# Patient Record
Sex: Female | Born: 1960 | Race: Black or African American | Hispanic: No | Marital: Married | Smoking: Never smoker
Health system: Southern US, Community
[De-identification: ages and names within clinical notes are randomized; demographics above are authoritative.]

## PROBLEM LIST (undated history)

## (undated) DIAGNOSIS — C169 Malignant neoplasm of stomach, unspecified: Secondary | ICD-10-CM

## (undated) DIAGNOSIS — I10 Essential (primary) hypertension: Secondary | ICD-10-CM

## (undated) DIAGNOSIS — K56609 Unspecified intestinal obstruction, unspecified as to partial versus complete obstruction: Secondary | ICD-10-CM

## (undated) DIAGNOSIS — D219 Benign neoplasm of connective and other soft tissue, unspecified: Secondary | ICD-10-CM

## (undated) DIAGNOSIS — D279 Benign neoplasm of unspecified ovary: Secondary | ICD-10-CM

## (undated) HISTORY — DX: Benign neoplasm of connective and other soft tissue, unspecified: D21.9

## (undated) HISTORY — DX: Malignant neoplasm of stomach, unspecified: C16.9

## (undated) HISTORY — DX: Benign neoplasm of unspecified ovary: D27.9

## (undated) HISTORY — DX: Essential (primary) hypertension: I10

## (undated) SURGERY — REMOVAL PORT-A-CATH
Anesthesia: LOCAL | Laterality: Left

---

## 1999-05-28 ENCOUNTER — Encounter: Payer: Self-pay | Admitting: Emergency Medicine

## 1999-05-28 ENCOUNTER — Emergency Department (HOSPITAL_COMMUNITY): Admission: EM | Admit: 1999-05-28 | Discharge: 1999-05-28 | Payer: Self-pay | Admitting: Emergency Medicine

## 1999-07-20 ENCOUNTER — Emergency Department (HOSPITAL_COMMUNITY): Admission: EM | Admit: 1999-07-20 | Discharge: 1999-07-20 | Payer: Self-pay | Admitting: Emergency Medicine

## 1999-07-20 ENCOUNTER — Encounter: Payer: Self-pay | Admitting: Emergency Medicine

## 2001-02-12 ENCOUNTER — Encounter: Payer: Self-pay | Admitting: Occupational Medicine

## 2001-02-12 ENCOUNTER — Encounter: Admission: RE | Admit: 2001-02-12 | Discharge: 2001-02-12 | Payer: Self-pay | Admitting: Occupational Medicine

## 2001-02-26 ENCOUNTER — Encounter: Admission: RE | Admit: 2001-02-26 | Discharge: 2001-03-30 | Payer: Self-pay | Admitting: Occupational Medicine

## 2003-05-20 ENCOUNTER — Ambulatory Visit (HOSPITAL_COMMUNITY): Admission: RE | Admit: 2003-05-20 | Discharge: 2003-05-20 | Payer: Self-pay | Admitting: Family Medicine

## 2003-05-20 ENCOUNTER — Encounter: Payer: Self-pay | Admitting: Family Medicine

## 2003-05-26 ENCOUNTER — Encounter: Admission: RE | Admit: 2003-05-26 | Discharge: 2003-05-26 | Payer: Self-pay | Admitting: Family Medicine

## 2003-05-26 ENCOUNTER — Encounter: Payer: Self-pay | Admitting: Family Medicine

## 2005-08-08 ENCOUNTER — Emergency Department (HOSPITAL_COMMUNITY): Admission: EM | Admit: 2005-08-08 | Discharge: 2005-08-08 | Payer: Self-pay | Admitting: Emergency Medicine

## 2006-08-11 ENCOUNTER — Emergency Department (HOSPITAL_COMMUNITY): Admission: EM | Admit: 2006-08-11 | Discharge: 2006-08-11 | Payer: Self-pay | Admitting: Emergency Medicine

## 2006-10-20 ENCOUNTER — Emergency Department (HOSPITAL_COMMUNITY): Admission: EM | Admit: 2006-10-20 | Discharge: 2006-10-20 | Payer: Self-pay | Admitting: Emergency Medicine

## 2007-05-04 ENCOUNTER — Encounter: Admission: RE | Admit: 2007-05-04 | Discharge: 2007-05-04 | Payer: Self-pay | Admitting: Internal Medicine

## 2008-08-21 ENCOUNTER — Emergency Department (HOSPITAL_COMMUNITY): Admission: EM | Admit: 2008-08-21 | Discharge: 2008-08-21 | Payer: Self-pay | Admitting: Emergency Medicine

## 2009-04-11 ENCOUNTER — Encounter: Admission: RE | Admit: 2009-04-11 | Discharge: 2009-04-11 | Payer: Self-pay | Admitting: Specialist

## 2009-10-14 HISTORY — PX: ESOPHAGOGASTRODUODENOSCOPY: SHX1529

## 2010-01-03 ENCOUNTER — Encounter: Admission: RE | Admit: 2010-01-03 | Discharge: 2010-01-03 | Payer: Self-pay | Admitting: Gastroenterology

## 2010-01-12 DIAGNOSIS — C169 Malignant neoplasm of stomach, unspecified: Secondary | ICD-10-CM

## 2010-01-12 HISTORY — DX: Malignant neoplasm of stomach, unspecified: C16.9

## 2010-02-05 ENCOUNTER — Inpatient Hospital Stay (HOSPITAL_COMMUNITY): Admission: RE | Admit: 2010-02-05 | Discharge: 2010-02-11 | Payer: Self-pay | Admitting: General Surgery

## 2010-02-05 ENCOUNTER — Encounter (INDEPENDENT_AMBULATORY_CARE_PROVIDER_SITE_OTHER): Payer: Self-pay | Admitting: General Surgery

## 2010-02-05 HISTORY — PX: BILROTH II PROCEDURE: SHX1232

## 2010-02-11 ENCOUNTER — Inpatient Hospital Stay (HOSPITAL_COMMUNITY): Admission: EM | Admit: 2010-02-11 | Discharge: 2010-02-14 | Payer: Self-pay | Admitting: Emergency Medicine

## 2010-02-14 ENCOUNTER — Ambulatory Visit: Payer: Self-pay | Admitting: Hematology and Oncology

## 2010-02-22 ENCOUNTER — Ambulatory Visit: Admission: RE | Admit: 2010-02-22 | Discharge: 2010-05-21 | Payer: Self-pay | Admitting: Radiation Oncology

## 2010-02-22 LAB — COMPREHENSIVE METABOLIC PANEL
Albumin: 4.3 g/dL (ref 3.5–5.2)
Alkaline Phosphatase: 73 U/L (ref 39–117)
BUN: 9 mg/dL (ref 6–23)
CO2: 19 mEq/L (ref 19–32)
Calcium: 9.6 mg/dL (ref 8.4–10.5)
Chloride: 102 mEq/L (ref 96–112)
Glucose, Bld: 99 mg/dL (ref 70–99)
Potassium: 4.1 mEq/L (ref 3.5–5.3)
Sodium: 135 mEq/L (ref 135–145)
Total Protein: 7.2 g/dL (ref 6.0–8.3)

## 2010-02-22 LAB — CBC WITH DIFFERENTIAL/PLATELET
Basophils Absolute: 0 10*3/uL (ref 0.0–0.1)
Eosinophils Absolute: 0.3 10*3/uL (ref 0.0–0.5)
HCT: 29.2 % — ABNORMAL LOW (ref 34.8–46.6)
HGB: 9.5 g/dL — ABNORMAL LOW (ref 11.6–15.9)
MONO#: 0.5 10*3/uL (ref 0.1–0.9)
NEUT#: 3.3 10*3/uL (ref 1.5–6.5)
NEUT%: 51.3 % (ref 38.4–76.8)
RDW: 14.7 % — ABNORMAL HIGH (ref 11.2–14.5)
lymph#: 2.4 10*3/uL (ref 0.9–3.3)

## 2010-02-27 ENCOUNTER — Encounter: Admission: RE | Admit: 2010-02-27 | Discharge: 2010-02-27 | Payer: Self-pay | Admitting: General Surgery

## 2010-03-01 ENCOUNTER — Ambulatory Visit (HOSPITAL_BASED_OUTPATIENT_CLINIC_OR_DEPARTMENT_OTHER): Admission: RE | Admit: 2010-03-01 | Discharge: 2010-03-01 | Payer: Self-pay | Admitting: General Surgery

## 2010-03-01 HISTORY — PX: PORTACATH PLACEMENT: SHX2246

## 2010-03-02 ENCOUNTER — Ambulatory Visit (HOSPITAL_COMMUNITY): Admission: RE | Admit: 2010-03-02 | Discharge: 2010-03-02 | Payer: Self-pay | Admitting: Hematology and Oncology

## 2010-03-08 LAB — CBC WITH DIFFERENTIAL/PLATELET
Eosinophils Absolute: 0.2 10*3/uL (ref 0.0–0.5)
HGB: 9.4 g/dL — ABNORMAL LOW (ref 11.6–15.9)
MCV: 79.1 fL — ABNORMAL LOW (ref 79.5–101.0)
MONO%: 6 % (ref 0.0–14.0)
NEUT#: 2 10*3/uL (ref 1.5–6.5)
RBC: 3.6 10*6/uL — ABNORMAL LOW (ref 3.70–5.45)
RDW: 14.1 % (ref 11.2–14.5)
WBC: 4.5 10*3/uL (ref 3.9–10.3)
lymph#: 2 10*3/uL (ref 0.9–3.3)

## 2010-03-08 LAB — COMPREHENSIVE METABOLIC PANEL
AST: 13 U/L (ref 0–37)
Albumin: 4.4 g/dL (ref 3.5–5.2)
Alkaline Phosphatase: 65 U/L (ref 39–117)
Calcium: 9.5 mg/dL (ref 8.4–10.5)
Chloride: 106 mEq/L (ref 96–112)
Glucose, Bld: 97 mg/dL (ref 70–99)
Potassium: 3.8 mEq/L (ref 3.5–5.3)
Sodium: 140 mEq/L (ref 135–145)
Total Protein: 6.8 g/dL (ref 6.0–8.3)

## 2010-03-16 ENCOUNTER — Ambulatory Visit: Payer: Self-pay | Admitting: Hematology and Oncology

## 2010-03-21 LAB — CBC WITH DIFFERENTIAL/PLATELET
BASO%: 0.3 % (ref 0.0–2.0)
EOS%: 1.3 % (ref 0.0–7.0)
LYMPH%: 43.7 % (ref 14.0–49.7)
MCH: 26.2 pg (ref 25.1–34.0)
MCHC: 33.3 g/dL (ref 31.5–36.0)
MONO#: 0.1 10*3/uL (ref 0.1–0.9)
MONO%: 1.5 % (ref 0.0–14.0)
Platelets: 390 10*3/uL (ref 145–400)
RBC: 3.88 10*6/uL (ref 3.70–5.45)
WBC: 6 10*3/uL (ref 3.9–10.3)

## 2010-03-21 LAB — COMPREHENSIVE METABOLIC PANEL
ALT: 10 U/L (ref 0–35)
AST: 11 U/L (ref 0–37)
Alkaline Phosphatase: 53 U/L (ref 39–117)
CO2: 19 mEq/L (ref 19–32)
Creatinine, Ser: 0.92 mg/dL (ref 0.40–1.20)
Sodium: 138 mEq/L (ref 135–145)
Total Bilirubin: 0.5 mg/dL (ref 0.3–1.2)
Total Protein: 7.3 g/dL (ref 6.0–8.3)

## 2010-03-23 LAB — CBC WITH DIFFERENTIAL/PLATELET
BASO%: 0.5 % (ref 0.0–2.0)
HCT: 28.5 % — ABNORMAL LOW (ref 34.8–46.6)
LYMPH%: 48.3 % (ref 14.0–49.7)
MCH: 26.2 pg (ref 25.1–34.0)
MCHC: 33.4 g/dL (ref 31.5–36.0)
MCV: 78.5 fL — ABNORMAL LOW (ref 79.5–101.0)
MONO%: 2.7 % (ref 0.0–14.0)
NEUT%: 46.4 % (ref 38.4–76.8)
Platelets: 397 10*3/uL (ref 145–400)
RBC: 3.63 10*6/uL — ABNORMAL LOW (ref 3.70–5.45)

## 2010-03-23 LAB — BASIC METABOLIC PANEL
CO2: 20 mEq/L (ref 19–32)
Calcium: 9.1 mg/dL (ref 8.4–10.5)
Creatinine, Ser: 0.89 mg/dL (ref 0.40–1.20)
Sodium: 139 mEq/L (ref 135–145)

## 2010-04-10 LAB — CBC WITH DIFFERENTIAL/PLATELET
BASO%: 0.2 % (ref 0.0–2.0)
EOS%: 1 % (ref 0.0–7.0)
HCT: 33.8 % — ABNORMAL LOW (ref 34.8–46.6)
HGB: 10.7 g/dL — ABNORMAL LOW (ref 11.6–15.9)
MCH: 25.1 pg (ref 25.1–34.0)
MCHC: 31.7 g/dL (ref 31.5–36.0)
MONO#: 0.6 10*3/uL (ref 0.1–0.9)
NEUT%: 49.6 % (ref 38.4–76.8)
RDW: 15 % — ABNORMAL HIGH (ref 11.2–14.5)
WBC: 8.1 10*3/uL (ref 3.9–10.3)
lymph#: 3.4 10*3/uL — ABNORMAL HIGH (ref 0.9–3.3)

## 2010-04-18 ENCOUNTER — Ambulatory Visit: Payer: Self-pay | Admitting: Hematology and Oncology

## 2010-04-18 LAB — COMPREHENSIVE METABOLIC PANEL
Albumin: 4.7 g/dL (ref 3.5–5.2)
CO2: 24 mEq/L (ref 19–32)
Calcium: 10.2 mg/dL (ref 8.4–10.5)
Chloride: 103 mEq/L (ref 96–112)
Glucose, Bld: 73 mg/dL (ref 70–99)
Potassium: 4.1 mEq/L (ref 3.5–5.3)
Sodium: 138 mEq/L (ref 135–145)
Total Bilirubin: 0.4 mg/dL (ref 0.3–1.2)
Total Protein: 7.9 g/dL (ref 6.0–8.3)

## 2010-04-18 LAB — CBC WITH DIFFERENTIAL/PLATELET
Eosinophils Absolute: 0 10*3/uL (ref 0.0–0.5)
HCT: 33.5 % — ABNORMAL LOW (ref 34.8–46.6)
LYMPH%: 27.7 % (ref 14.0–49.7)
MONO#: 0.5 10*3/uL (ref 0.1–0.9)
NEUT#: 5.7 10*3/uL (ref 1.5–6.5)
Platelets: 268 10*3/uL (ref 145–400)
RBC: 4.24 10*6/uL (ref 3.70–5.45)
WBC: 8.6 10*3/uL (ref 3.9–10.3)

## 2010-04-24 LAB — CBC WITH DIFFERENTIAL/PLATELET
BASO%: 0.3 % (ref 0.0–2.0)
Eosinophils Absolute: 0.1 10*3/uL (ref 0.0–0.5)
MONO#: 0.4 10*3/uL (ref 0.1–0.9)
NEUT#: 3.5 10*3/uL (ref 1.5–6.5)
RBC: 4.23 10*6/uL (ref 3.70–5.45)
RDW: 15.8 % — ABNORMAL HIGH (ref 11.2–14.5)
WBC: 5.2 10*3/uL (ref 3.9–10.3)
lymph#: 1.2 10*3/uL (ref 0.9–3.3)

## 2010-05-03 LAB — BASIC METABOLIC PANEL
CO2: 22 mEq/L (ref 19–32)
Calcium: 9.4 mg/dL (ref 8.4–10.5)
Chloride: 103 mEq/L (ref 96–112)
Creatinine, Ser: 0.88 mg/dL (ref 0.40–1.20)
Glucose, Bld: 126 mg/dL — ABNORMAL HIGH (ref 70–99)
Sodium: 137 mEq/L (ref 135–145)

## 2010-05-03 LAB — CBC WITH DIFFERENTIAL/PLATELET
Eosinophils Absolute: 0.1 10*3/uL (ref 0.0–0.5)
HCT: 32.1 % — ABNORMAL LOW (ref 34.8–46.6)
LYMPH%: 12.4 % — ABNORMAL LOW (ref 14.0–49.7)
MONO#: 0.3 10*3/uL (ref 0.1–0.9)
NEUT#: 3.8 10*3/uL (ref 1.5–6.5)
NEUT%: 79.4 % — ABNORMAL HIGH (ref 38.4–76.8)
Platelets: 234 10*3/uL (ref 145–400)
WBC: 4.8 10*3/uL (ref 3.9–10.3)
lymph#: 0.6 10*3/uL — ABNORMAL LOW (ref 0.9–3.3)

## 2010-05-08 LAB — CBC WITH DIFFERENTIAL/PLATELET
BASO%: 0.2 % (ref 0.0–2.0)
Basophils Absolute: 0 10*3/uL (ref 0.0–0.1)
EOS%: 1.7 % (ref 0.0–7.0)
HCT: 34.3 % — ABNORMAL LOW (ref 34.8–46.6)
LYMPH%: 15 % (ref 14.0–49.7)
MCH: 25.2 pg (ref 25.1–34.0)
MCHC: 31.5 g/dL (ref 31.5–36.0)
MCV: 80.1 fL (ref 79.5–101.0)
MONO%: 11.1 % (ref 0.0–14.0)
NEUT%: 72 % (ref 38.4–76.8)
lymph#: 0.6 10*3/uL — ABNORMAL LOW (ref 0.9–3.3)

## 2010-05-21 ENCOUNTER — Ambulatory Visit: Payer: Self-pay | Admitting: Hematology and Oncology

## 2010-05-23 LAB — COMPREHENSIVE METABOLIC PANEL
CO2: 23 mEq/L (ref 19–32)
Creatinine, Ser: 0.91 mg/dL (ref 0.40–1.20)
Glucose, Bld: 93 mg/dL (ref 70–99)
Total Bilirubin: 0.4 mg/dL (ref 0.3–1.2)
Total Protein: 7.3 g/dL (ref 6.0–8.3)

## 2010-05-23 LAB — CBC WITH DIFFERENTIAL/PLATELET
Basophils Absolute: 0 10*3/uL (ref 0.0–0.1)
Eosinophils Absolute: 0.1 10*3/uL (ref 0.0–0.5)
HCT: 34.1 % — ABNORMAL LOW (ref 34.8–46.6)
LYMPH%: 7.2 % — ABNORMAL LOW (ref 14.0–49.7)
MONO#: 0.5 10*3/uL (ref 0.1–0.9)
NEUT#: 3.7 10*3/uL (ref 1.5–6.5)
NEUT%: 80 % — ABNORMAL HIGH (ref 38.4–76.8)
Platelets: 223 10*3/uL (ref 145–400)
WBC: 4.7 10*3/uL (ref 3.9–10.3)

## 2010-06-20 ENCOUNTER — Ambulatory Visit: Payer: Self-pay | Admitting: Hematology and Oncology

## 2010-06-21 LAB — COMPREHENSIVE METABOLIC PANEL WITH GFR
ALT: 19 U/L (ref 0–35)
AST: 21 U/L (ref 0–37)
Albumin: 4.1 g/dL (ref 3.5–5.2)
Alkaline Phosphatase: 95 U/L (ref 39–117)
BUN: 16 mg/dL (ref 6–23)
CO2: 24 meq/L (ref 19–32)
Calcium: 9.9 mg/dL (ref 8.4–10.5)
Chloride: 107 meq/L (ref 96–112)
Creatinine, Ser: 1.08 mg/dL (ref 0.40–1.20)
Glucose, Bld: 128 mg/dL — ABNORMAL HIGH (ref 70–99)
Potassium: 4.5 meq/L (ref 3.5–5.3)
Sodium: 142 meq/L (ref 135–145)
Total Bilirubin: 0.4 mg/dL (ref 0.3–1.2)
Total Protein: 7.2 g/dL (ref 6.0–8.3)

## 2010-06-21 LAB — CBC WITH DIFFERENTIAL/PLATELET
BASO%: 0.4 % (ref 0.0–2.0)
Basophils Absolute: 0 10e3/uL (ref 0.0–0.1)
EOS%: 4.6 % (ref 0.0–7.0)
Eosinophils Absolute: 0.2 10e3/uL (ref 0.0–0.5)
HCT: 32.4 % — ABNORMAL LOW (ref 34.8–46.6)
HGB: 10.6 g/dL — ABNORMAL LOW (ref 11.6–15.9)
LYMPH%: 24.6 % (ref 14.0–49.7)
MCH: 25.9 pg (ref 25.1–34.0)
MCHC: 32.8 g/dL (ref 31.5–36.0)
MCV: 78.9 fL — ABNORMAL LOW (ref 79.5–101.0)
MONO#: 0.4 10e3/uL (ref 0.1–0.9)
MONO%: 10.1 % (ref 0.0–14.0)
NEUT#: 2.4 10e3/uL (ref 1.5–6.5)
NEUT%: 60.3 % (ref 38.4–76.8)
Platelets: 251 10e3/uL (ref 145–400)
RBC: 4.1 10e6/uL (ref 3.70–5.45)
RDW: 19 % — ABNORMAL HIGH (ref 11.2–14.5)
WBC: 4 10e3/uL (ref 3.9–10.3)
lymph#: 1 10e3/uL (ref 0.9–3.3)

## 2010-06-21 LAB — CEA: CEA: 0.6 ng/mL (ref 0.0–5.0)

## 2010-07-03 ENCOUNTER — Emergency Department (HOSPITAL_COMMUNITY): Admission: EM | Admit: 2010-07-03 | Discharge: 2010-07-04 | Payer: Self-pay | Admitting: Occupational Therapy

## 2010-07-09 LAB — CBC WITH DIFFERENTIAL/PLATELET
Eosinophils Absolute: 0 10*3/uL (ref 0.0–0.5)
HGB: 9.7 g/dL — ABNORMAL LOW (ref 11.6–15.9)
MONO#: 0.4 10*3/uL (ref 0.1–0.9)
NEUT#: 2.6 10*3/uL (ref 1.5–6.5)
Platelets: 240 10*3/uL (ref 145–400)
RBC: 3.67 10*6/uL — ABNORMAL LOW (ref 3.70–5.45)
RDW: 17.8 % — ABNORMAL HIGH (ref 11.2–14.5)
WBC: 3.9 10*3/uL (ref 3.9–10.3)

## 2010-07-18 LAB — COMPREHENSIVE METABOLIC PANEL
AST: 19 U/L (ref 0–37)
Albumin: 4 g/dL (ref 3.5–5.2)
Alkaline Phosphatase: 89 U/L (ref 39–117)
BUN: 21 mg/dL (ref 6–23)
Glucose, Bld: 158 mg/dL — ABNORMAL HIGH (ref 70–99)
Potassium: 4.3 mEq/L (ref 3.5–5.3)
Sodium: 136 mEq/L (ref 135–145)
Total Bilirubin: 0.4 mg/dL (ref 0.3–1.2)

## 2010-07-18 LAB — CBC WITH DIFFERENTIAL/PLATELET
Basophils Absolute: 0 10*3/uL (ref 0.0–0.1)
EOS%: 0.2 % (ref 0.0–7.0)
LYMPH%: 38 % (ref 14.0–49.7)
MCH: 26.8 pg (ref 25.1–34.0)
MCV: 80.3 fL (ref 79.5–101.0)
MONO%: 16.9 % — ABNORMAL HIGH (ref 0.0–14.0)
Platelets: 298 10*3/uL (ref 145–400)
RBC: 3.87 10*6/uL (ref 3.70–5.45)
RDW: 17.6 % — ABNORMAL HIGH (ref 11.2–14.5)

## 2010-07-20 ENCOUNTER — Ambulatory Visit: Payer: Self-pay | Admitting: Hematology and Oncology

## 2010-07-23 LAB — CBC WITH DIFFERENTIAL/PLATELET
Basophils Absolute: 0 10*3/uL (ref 0.0–0.1)
EOS%: 0.7 % (ref 0.0–7.0)
Eosinophils Absolute: 0 10*3/uL (ref 0.0–0.5)
HCT: 31.7 % — ABNORMAL LOW (ref 34.8–46.6)
HGB: 10.1 g/dL — ABNORMAL LOW (ref 11.6–15.9)
LYMPH%: 20.3 % (ref 14.0–49.7)
MCH: 25.6 pg (ref 25.1–34.0)
MCV: 80.5 fL (ref 79.5–101.0)
MONO%: 14.4 % — ABNORMAL HIGH (ref 0.0–14.0)
NEUT#: 3 10*3/uL (ref 1.5–6.5)
NEUT%: 64.2 % (ref 38.4–76.8)
Platelets: 213 10*3/uL (ref 145–400)
RDW: 16.3 % — ABNORMAL HIGH (ref 11.2–14.5)

## 2010-07-30 LAB — CBC WITH DIFFERENTIAL/PLATELET
EOS%: 0.1 % (ref 0.0–7.0)
Eosinophils Absolute: 0 10*3/uL (ref 0.0–0.5)
LYMPH%: 17.1 % (ref 14.0–49.7)
MCH: 26.5 pg (ref 25.1–34.0)
MCHC: 33 g/dL (ref 31.5–36.0)
MCV: 80.2 fL (ref 79.5–101.0)
MONO%: 1.5 % (ref 0.0–14.0)
Platelets: 224 10*3/uL (ref 145–400)
RBC: 3.9 10*6/uL (ref 3.70–5.45)
RDW: 16.9 % — ABNORMAL HIGH (ref 11.2–14.5)

## 2010-08-06 LAB — COMPREHENSIVE METABOLIC PANEL
ALT: 30 U/L (ref 0–35)
Albumin: 4.1 g/dL (ref 3.5–5.2)
Alkaline Phosphatase: 77 U/L (ref 39–117)
CO2: 23 mEq/L (ref 19–32)
Potassium: 4.3 mEq/L (ref 3.5–5.3)
Sodium: 140 mEq/L (ref 135–145)
Total Bilirubin: 0.5 mg/dL (ref 0.3–1.2)
Total Protein: 7 g/dL (ref 6.0–8.3)

## 2010-08-06 LAB — CBC WITH DIFFERENTIAL/PLATELET
BASO%: 0.3 % (ref 0.0–2.0)
Eosinophils Absolute: 0 10*3/uL (ref 0.0–0.5)
LYMPH%: 17.5 % (ref 14.0–49.7)
MCHC: 33.1 g/dL (ref 31.5–36.0)
MONO#: 0.1 10*3/uL (ref 0.1–0.9)
MONO%: 4.4 % (ref 0.0–14.0)
NEUT#: 2.6 10*3/uL (ref 1.5–6.5)
Platelets: 220 10*3/uL (ref 145–400)
RBC: 3.58 10*6/uL — ABNORMAL LOW (ref 3.70–5.45)
RDW: 16.3 % — ABNORMAL HIGH (ref 11.2–14.5)
WBC: 3.4 10*3/uL — ABNORMAL LOW (ref 3.9–10.3)

## 2010-09-03 ENCOUNTER — Other Ambulatory Visit: Payer: Self-pay | Admitting: Hematology and Oncology

## 2010-09-03 ENCOUNTER — Ambulatory Visit (HOSPITAL_COMMUNITY)
Admission: RE | Admit: 2010-09-03 | Discharge: 2010-09-03 | Payer: Self-pay | Source: Home / Self Care | Admitting: Hematology and Oncology

## 2010-09-03 LAB — COMPREHENSIVE METABOLIC PANEL
AST: 24 U/L (ref 0–37)
Albumin: 3.6 g/dL (ref 3.5–5.2)
BUN: 13 mg/dL (ref 6–23)
CO2: 26 mEq/L (ref 19–32)
Calcium: 10 mg/dL (ref 8.4–10.5)
Chloride: 105 mEq/L (ref 96–112)
Creatinine, Ser: 0.94 mg/dL (ref 0.40–1.20)
Glucose, Bld: 82 mg/dL (ref 70–99)
Potassium: 4.2 mEq/L (ref 3.5–5.3)

## 2010-09-03 LAB — CBC WITH DIFFERENTIAL/PLATELET
Basophils Absolute: 0 10*3/uL (ref 0.0–0.1)
Eosinophils Absolute: 0.2 10*3/uL (ref 0.0–0.5)
HCT: 30.8 % — ABNORMAL LOW (ref 34.8–46.6)
HGB: 10.2 g/dL — ABNORMAL LOW (ref 11.6–15.9)
MCH: 26 pg (ref 25.1–34.0)
MONO#: 0.5 10*3/uL (ref 0.1–0.9)
NEUT#: 2.9 10*3/uL (ref 1.5–6.5)
NEUT%: 64.3 % (ref 38.4–76.8)
RDW: 16.9 % — ABNORMAL HIGH (ref 11.2–14.5)
lymph#: 0.9 10*3/uL (ref 0.9–3.3)

## 2010-09-03 LAB — CEA: CEA: 0.8 ng/mL (ref 0.0–5.0)

## 2010-09-04 ENCOUNTER — Other Ambulatory Visit: Payer: Self-pay | Admitting: Hematology and Oncology

## 2010-09-05 LAB — IRON AND TIBC
%SAT: 16 % — ABNORMAL LOW (ref 20–55)
Iron: 74 ug/dL (ref 42–145)

## 2010-09-05 LAB — VITAMIN B12: Vitamin B-12: 759 pg/mL (ref 211–911)

## 2010-09-05 LAB — FERRITIN: Ferritin: 18 ng/mL (ref 10–291)

## 2010-09-28 ENCOUNTER — Ambulatory Visit: Payer: Self-pay | Admitting: Obstetrics and Gynecology

## 2010-10-01 ENCOUNTER — Ambulatory Visit (HOSPITAL_COMMUNITY)
Admission: RE | Admit: 2010-10-01 | Discharge: 2010-10-01 | Payer: Self-pay | Source: Home / Self Care | Attending: Family Medicine | Admitting: Family Medicine

## 2010-10-05 ENCOUNTER — Ambulatory Visit (HOSPITAL_COMMUNITY)
Admission: RE | Admit: 2010-10-05 | Discharge: 2010-10-05 | Payer: Self-pay | Source: Home / Self Care | Attending: Obstetrics & Gynecology | Admitting: Obstetrics & Gynecology

## 2010-10-18 ENCOUNTER — Ambulatory Visit: Payer: Self-pay | Admitting: Hematology and Oncology

## 2010-10-19 ENCOUNTER — Ambulatory Visit
Admission: RE | Admit: 2010-10-19 | Discharge: 2010-10-19 | Payer: Self-pay | Source: Home / Self Care | Attending: Obstetrics and Gynecology | Admitting: Obstetrics and Gynecology

## 2010-11-02 ENCOUNTER — Other Ambulatory Visit: Payer: Self-pay | Admitting: Hematology and Oncology

## 2010-11-02 DIAGNOSIS — C169 Malignant neoplasm of stomach, unspecified: Secondary | ICD-10-CM

## 2010-11-04 ENCOUNTER — Encounter: Payer: Self-pay | Admitting: Family Medicine

## 2010-11-28 ENCOUNTER — Other Ambulatory Visit (HOSPITAL_COMMUNITY): Payer: Self-pay

## 2010-12-03 ENCOUNTER — Other Ambulatory Visit: Payer: Self-pay | Admitting: Hematology and Oncology

## 2010-12-03 ENCOUNTER — Encounter (HOSPITAL_BASED_OUTPATIENT_CLINIC_OR_DEPARTMENT_OTHER): Payer: BC Managed Care – PPO | Admitting: Hematology and Oncology

## 2010-12-03 ENCOUNTER — Ambulatory Visit (HOSPITAL_COMMUNITY)
Admission: RE | Admit: 2010-12-03 | Discharge: 2010-12-03 | Disposition: A | Discharge status: Home / Self Care | Payer: BC Managed Care – PPO | Source: Ambulatory Visit | Attending: Hematology and Oncology | Admitting: Hematology and Oncology

## 2010-12-03 DIAGNOSIS — D279 Benign neoplasm of unspecified ovary: Secondary | ICD-10-CM | POA: Insufficient documentation

## 2010-12-03 DIAGNOSIS — Z5111 Encounter for antineoplastic chemotherapy: Secondary | ICD-10-CM

## 2010-12-03 DIAGNOSIS — C169 Malignant neoplasm of stomach, unspecified: Secondary | ICD-10-CM

## 2010-12-03 LAB — CBC WITH DIFFERENTIAL/PLATELET
BASO%: 0.5 % (ref 0.0–2.0)
EOS%: 0.7 % (ref 0.0–7.0)
LYMPH%: 21.9 % (ref 14.0–49.7)
MCH: 25.5 pg (ref 25.1–34.0)
MCHC: 33 g/dL (ref 31.5–36.0)
MCV: 77.4 fL — ABNORMAL LOW (ref 79.5–101.0)
MONO%: 8.7 % (ref 0.0–14.0)
Platelets: 279 10*3/uL (ref 145–400)
RBC: 4.7 10*6/uL (ref 3.70–5.45)
RDW: 14.2 % (ref 11.2–14.5)
nRBC: 0 % (ref 0–0)

## 2010-12-03 LAB — CEA: CEA: 1.3 ng/mL (ref 0.0–5.0)

## 2010-12-03 LAB — CMP (CANCER CENTER ONLY)
ALT(SGPT): 27 U/L (ref 10–47)
AST: 25 U/L (ref 11–38)
Albumin: 3.8 g/dL (ref 3.3–5.5)
CO2: 28 mEq/L (ref 18–33)
Calcium: 9.6 mg/dL (ref 8.0–10.3)
Chloride: 100 mEq/L (ref 98–108)
Potassium: 3.9 mEq/L (ref 3.3–4.7)
Sodium: 142 mEq/L (ref 128–145)
Total Protein: 8 g/dL (ref 6.4–8.1)

## 2010-12-03 MED ORDER — IOHEXOL 300 MG/ML  SOLN: 100.0000 mL | Freq: Once | INTRAMUSCULAR | Status: AC | PRN

## 2010-12-05 ENCOUNTER — Encounter (HOSPITAL_BASED_OUTPATIENT_CLINIC_OR_DEPARTMENT_OTHER): Payer: BC Managed Care – PPO | Admitting: Hematology and Oncology

## 2010-12-05 DIAGNOSIS — C169 Malignant neoplasm of stomach, unspecified: Secondary | ICD-10-CM

## 2010-12-05 DIAGNOSIS — Z9089 Acquired absence of other organs: Secondary | ICD-10-CM

## 2010-12-07 ENCOUNTER — Other Ambulatory Visit: Payer: Self-pay | Admitting: Hematology and Oncology

## 2010-12-07 DIAGNOSIS — C169 Malignant neoplasm of stomach, unspecified: Secondary | ICD-10-CM

## 2010-12-27 LAB — COMPREHENSIVE METABOLIC PANEL
AST: 140 U/L — ABNORMAL HIGH (ref 0–37)
Albumin: 3.7 g/dL (ref 3.5–5.2)
Alkaline Phosphatase: 85 U/L (ref 39–117)
BUN: 17 mg/dL (ref 6–23)
CO2: 23 mEq/L (ref 19–32)
Chloride: 105 mEq/L (ref 96–112)
GFR calc non Af Amer: 57 mL/min — ABNORMAL LOW (ref >60)
Potassium: 4.1 mEq/L (ref 3.5–5.1)
Total Bilirubin: 1.1 mg/dL (ref 0.3–1.2)

## 2010-12-27 LAB — CBC
Hemoglobin: 9.1 g/dL — ABNORMAL LOW (ref 12.0–15.0)
MCH: 26.4 pg (ref 26.0–34.0)
MCV: 79 fL (ref 78.0–100.0)
Platelets: 212 10*3/uL (ref 150–400)
RBC: 3.47 MIL/uL — ABNORMAL LOW (ref 3.87–5.11)
WBC: 4.8 10*3/uL (ref 4.0–10.5)

## 2010-12-27 LAB — URINALYSIS, ROUTINE W REFLEX MICROSCOPIC
Bilirubin Urine: NEGATIVE
Hgb urine dipstick: NEGATIVE
Nitrite: NEGATIVE
Protein, ur: NEGATIVE mg/dL
Urobilinogen, UA: 0.2 mg/dL (ref 0.0–1.0)

## 2010-12-27 LAB — DIFFERENTIAL
Basophils Absolute: 0 10*3/uL (ref 0.0–0.1)
Basophils Relative: 0 % (ref 0–1)
Eosinophils Relative: 1 % (ref 0–5)
Monocytes Absolute: 0.1 10*3/uL (ref 0.1–1.0)
Neutro Abs: 3.9 10*3/uL (ref 1.7–7.7)

## 2010-12-27 LAB — URINE MICROSCOPIC-ADD ON

## 2010-12-27 LAB — LIPASE, BLOOD: Lipase: 44 U/L (ref 11–59)

## 2010-12-31 LAB — BASIC METABOLIC PANEL
CO2: 27 mEq/L (ref 19–32)
Calcium: 9.8 mg/dL (ref 8.4–10.5)
GFR calc Af Amer: >60 mL/min (ref >60)
GFR calc non Af Amer: >60 mL/min (ref >60)
Glucose, Bld: 101 mg/dL — ABNORMAL HIGH (ref 70–99)
Potassium: 4.4 mEq/L (ref 3.5–5.1)
Sodium: 138 mEq/L (ref 135–145)

## 2010-12-31 LAB — GLUCOSE, CAPILLARY: Glucose-Capillary: 108 mg/dL — ABNORMAL HIGH (ref 70–99)

## 2011-01-01 LAB — COMPREHENSIVE METABOLIC PANEL
ALT: 119 U/L — ABNORMAL HIGH (ref 0–35)
ALT: 88 U/L — ABNORMAL HIGH (ref 0–35)
ALT: 17 U/L (ref 0–35)
AST: 53 U/L — ABNORMAL HIGH (ref 0–37)
AST: 89 U/L — ABNORMAL HIGH (ref 0–37)
Albumin: 3.4 g/dL — ABNORMAL LOW (ref 3.5–5.2)
Alkaline Phosphatase: 72 U/L (ref 39–117)
CO2: 26 mEq/L (ref 19–32)
CO2: 27 mEq/L (ref 19–32)
Calcium: 8.6 mg/dL (ref 8.4–10.5)
Calcium: 9.5 mg/dL (ref 8.4–10.5)
Calcium: 9.5 mg/dL (ref 8.4–10.5)
Creatinine, Ser: 0.76 mg/dL (ref 0.4–1.2)
GFR calc Af Amer: >60 mL/min (ref >60)
GFR calc Af Amer: >60 mL/min (ref >60)
GFR calc Af Amer: >60 mL/min (ref >60)
GFR calc non Af Amer: >60 mL/min (ref >60)
Glucose, Bld: 103 mg/dL — ABNORMAL HIGH (ref 70–99)
Potassium: 3.4 mEq/L — ABNORMAL LOW (ref 3.5–5.1)
Sodium: 136 mEq/L (ref 135–145)
Sodium: 140 mEq/L (ref 135–145)
Sodium: 140 mEq/L (ref 135–145)
Total Protein: 5.9 g/dL — ABNORMAL LOW (ref 6.0–8.3)
Total Protein: 7.4 g/dL (ref 6.0–8.3)
Total Protein: 7.2 g/dL (ref 6.0–8.3)

## 2011-01-01 LAB — CBC
HCT: 26 % — ABNORMAL LOW (ref 36.0–46.0)
HCT: 28 % — ABNORMAL LOW (ref 36.0–46.0)
HCT: 33.5 % — ABNORMAL LOW (ref 36.0–46.0)
Hemoglobin: 9.5 g/dL — ABNORMAL LOW (ref 12.0–15.0)
MCHC: 33.4 g/dL (ref 30.0–36.0)
MCHC: 33.5 g/dL (ref 30.0–36.0)
MCV: 79.4 fL (ref 78.0–100.0)
MCV: 80.1 fL (ref 78.0–100.0)
Platelets: 302 10*3/uL (ref 150–400)
Platelets: 337 10*3/uL (ref 150–400)
Platelets: 292 10*3/uL (ref 150–400)
RBC: 3.13 MIL/uL — ABNORMAL LOW (ref 3.87–5.11)
RBC: 3.9 MIL/uL (ref 3.87–5.11)
RBC: 3.53 MIL/uL — ABNORMAL LOW (ref 3.87–5.11)
RDW: 13.4 % (ref 11.5–15.5)
RDW: 13.6 % (ref 11.5–15.5)
RDW: 13.8 % (ref 11.5–15.5)
RDW: 14.2 % (ref 11.5–15.5)
WBC: 8.8 10*3/uL (ref 4.0–10.5)

## 2011-01-01 LAB — DIFFERENTIAL
Basophils Absolute: 0 10*3/uL (ref 0.0–0.1)
Basophils Relative: 0 % (ref 0–1)
Eosinophils Absolute: 0.1 10*3/uL (ref 0.0–0.7)
Eosinophils Absolute: 0.3 10*3/uL (ref 0.0–0.7)
Eosinophils Absolute: 0.2 10*3/uL (ref 0.0–0.7)
Eosinophils Relative: 2 % (ref 0–5)
Eosinophils Relative: 6 % — ABNORMAL HIGH (ref 0–5)
Lymphocytes Relative: 22 % (ref 12–46)
Lymphs Abs: 1.7 10*3/uL (ref 0.7–4.0)
Lymphs Abs: 2 10*3/uL (ref 0.7–4.0)
Lymphs Abs: 2.4 10*3/uL (ref 0.7–4.0)
Monocytes Relative: 6 % (ref 3–12)
Monocytes Relative: 7 % (ref 3–12)
Monocytes Relative: 7 % (ref 3–12)
Neutro Abs: 6.1 10*3/uL (ref 1.7–7.7)
Neutrophils Relative %: 46 % (ref 43–77)
Neutrophils Relative %: 69 % (ref 43–77)

## 2011-01-01 LAB — URINALYSIS, MICROSCOPIC ONLY
Glucose, UA: NEGATIVE mg/dL
Hgb urine dipstick: NEGATIVE
Specific Gravity, Urine: 1.006 (ref 1.005–1.030)

## 2011-01-01 LAB — CROSSMATCH: Antibody Screen: NEGATIVE

## 2011-01-01 LAB — ABO/RH: ABO/RH(D): O POS

## 2011-01-01 LAB — URINALYSIS, ROUTINE W REFLEX MICROSCOPIC
Bilirubin Urine: NEGATIVE
Hgb urine dipstick: NEGATIVE
Ketones, ur: 15 mg/dL — AB
Nitrite: NEGATIVE
Protein, ur: NEGATIVE mg/dL
Specific Gravity, Urine: 1.021 (ref 1.005–1.030)
Urobilinogen, UA: 0.2 mg/dL (ref 0.0–1.0)

## 2011-01-01 LAB — CANCER ANTIGEN 27.29: CA 27.29: 10 U/mL (ref 0–39)

## 2011-01-01 LAB — PROTIME-INR: Prothrombin Time: 14.1 seconds (ref 11.6–15.2)

## 2011-01-01 LAB — TYPE AND SCREEN
ABO/RH(D): O POS
Antibody Screen: NEGATIVE

## 2011-01-01 LAB — BASIC METABOLIC PANEL
GFR calc Af Amer: >60 mL/min (ref >60)
GFR calc non Af Amer: >60 mL/min (ref >60)
Potassium: 3.9 mEq/L (ref 3.5–5.1)
Sodium: 134 mEq/L — ABNORMAL LOW (ref 135–145)

## 2011-01-01 LAB — URINE CULTURE

## 2011-01-01 LAB — CEA: CEA: 1 ng/mL (ref 0.0–5.0)

## 2011-01-01 LAB — GASTRIC OCCULT BLOOD (1-CARD TO LAB): Occult Blood, Gastric: POSITIVE — AB

## 2011-01-16 ENCOUNTER — Encounter (HOSPITAL_BASED_OUTPATIENT_CLINIC_OR_DEPARTMENT_OTHER): Payer: BC Managed Care – PPO | Admitting: Hematology and Oncology

## 2011-01-16 DIAGNOSIS — C169 Malignant neoplasm of stomach, unspecified: Secondary | ICD-10-CM

## 2011-01-16 DIAGNOSIS — Z452 Encounter for adjustment and management of vascular access device: Secondary | ICD-10-CM

## 2011-02-27 ENCOUNTER — Encounter (HOSPITAL_BASED_OUTPATIENT_CLINIC_OR_DEPARTMENT_OTHER): Payer: BC Managed Care – PPO | Admitting: Hematology and Oncology

## 2011-02-27 DIAGNOSIS — C169 Malignant neoplasm of stomach, unspecified: Secondary | ICD-10-CM

## 2011-02-27 DIAGNOSIS — Z452 Encounter for adjustment and management of vascular access device: Secondary | ICD-10-CM

## 2011-04-01 ENCOUNTER — Other Ambulatory Visit: Payer: Self-pay | Admitting: Hematology and Oncology

## 2011-04-01 ENCOUNTER — Ambulatory Visit (HOSPITAL_COMMUNITY)
Admission: RE | Admit: 2011-04-01 | Discharge: 2011-04-01 | Disposition: A | Discharge status: Home / Self Care | Payer: BC Managed Care – PPO | Source: Ambulatory Visit | Attending: Hematology and Oncology | Admitting: Hematology and Oncology

## 2011-04-01 ENCOUNTER — Encounter (HOSPITAL_BASED_OUTPATIENT_CLINIC_OR_DEPARTMENT_OTHER): Payer: BC Managed Care – PPO | Admitting: Hematology and Oncology

## 2011-04-01 DIAGNOSIS — Z9089 Acquired absence of other organs: Secondary | ICD-10-CM

## 2011-04-01 DIAGNOSIS — K439 Ventral hernia without obstruction or gangrene: Secondary | ICD-10-CM | POA: Insufficient documentation

## 2011-04-01 DIAGNOSIS — D279 Benign neoplasm of unspecified ovary: Secondary | ICD-10-CM | POA: Insufficient documentation

## 2011-04-01 DIAGNOSIS — K429 Umbilical hernia without obstruction or gangrene: Secondary | ICD-10-CM | POA: Insufficient documentation

## 2011-04-01 DIAGNOSIS — D259 Leiomyoma of uterus, unspecified: Secondary | ICD-10-CM | POA: Insufficient documentation

## 2011-04-01 DIAGNOSIS — C169 Malignant neoplasm of stomach, unspecified: Secondary | ICD-10-CM | POA: Insufficient documentation

## 2011-04-01 LAB — CEA: CEA: 0.6 ng/mL (ref 0.0–5.0)

## 2011-04-01 LAB — CMP (CANCER CENTER ONLY)
Albumin: 3.5 g/dL (ref 3.3–5.5)
Alkaline Phosphatase: 124 U/L — ABNORMAL HIGH (ref 26–84)
BUN, Bld: 17 mg/dL (ref 7–22)
CO2: 30 mEq/L (ref 18–33)
Calcium: 9.6 mg/dL (ref 8.0–10.3)
Glucose, Bld: 97 mg/dL (ref 73–118)
Potassium: 4.3 mEq/L (ref 3.3–4.7)

## 2011-04-01 LAB — CBC WITH DIFFERENTIAL/PLATELET
Basophils Absolute: 0 10*3/uL (ref 0.0–0.1)
Eosinophils Absolute: 0.1 10*3/uL (ref 0.0–0.5)
HGB: 10.9 g/dL — ABNORMAL LOW (ref 11.6–15.9)
MCV: 79.7 fL (ref 79.5–101.0)
MONO#: 0.3 10*3/uL (ref 0.1–0.9)
MONO%: 8.3 % (ref 0.0–14.0)
NEUT#: 1.8 10*3/uL (ref 1.5–6.5)
Platelets: 235 10*3/uL (ref 145–400)
RDW: 13.7 % (ref 11.2–14.5)

## 2011-04-01 MED ORDER — IOHEXOL 300 MG/ML  SOLN
100.0000 mL | Freq: Once | INTRAMUSCULAR | Status: AC | PRN
  Administered 2011-04-01: 100 mL via INTRAVENOUS

## 2011-04-02 ENCOUNTER — Other Ambulatory Visit (HOSPITAL_COMMUNITY): Payer: BC Managed Care – PPO

## 2011-04-04 ENCOUNTER — Other Ambulatory Visit: Payer: Self-pay | Admitting: Hematology and Oncology

## 2011-04-04 ENCOUNTER — Encounter (HOSPITAL_BASED_OUTPATIENT_CLINIC_OR_DEPARTMENT_OTHER): Payer: BC Managed Care – PPO | Admitting: Hematology and Oncology

## 2011-04-04 ENCOUNTER — Encounter: Payer: Self-pay | Admitting: Internal Medicine

## 2011-04-04 DIAGNOSIS — C169 Malignant neoplasm of stomach, unspecified: Secondary | ICD-10-CM

## 2011-04-10 ENCOUNTER — Encounter: Payer: BC Managed Care – PPO | Admitting: Hematology and Oncology

## 2011-04-13 DIAGNOSIS — C169 Malignant neoplasm of stomach, unspecified: Secondary | ICD-10-CM

## 2011-04-13 DIAGNOSIS — D219 Benign neoplasm of connective and other soft tissue, unspecified: Secondary | ICD-10-CM

## 2011-04-13 DIAGNOSIS — D279 Benign neoplasm of unspecified ovary: Secondary | ICD-10-CM

## 2011-04-22 ENCOUNTER — Ambulatory Visit: Payer: BC Managed Care – PPO | Admitting: Family Medicine

## 2011-05-20 ENCOUNTER — Ambulatory Visit (INDEPENDENT_AMBULATORY_CARE_PROVIDER_SITE_OTHER): Payer: Managed Care, Other (non HMO) | Admitting: Internal Medicine

## 2011-05-20 ENCOUNTER — Encounter: Payer: Self-pay | Admitting: Internal Medicine

## 2011-05-20 VITALS — BP 110/64 | HR 68 | Ht 68.0 in | Wt 201.6 lb

## 2011-05-20 DIAGNOSIS — Z1211 Encounter for screening for malignant neoplasm of colon: Secondary | ICD-10-CM

## 2011-05-20 DIAGNOSIS — D649 Anemia, unspecified: Secondary | ICD-10-CM | POA: Insufficient documentation

## 2011-05-20 DIAGNOSIS — C169 Malignant neoplasm of stomach, unspecified: Secondary | ICD-10-CM

## 2011-05-20 MED ORDER — PEG-KCL-NACL-NASULF-NA ASC-C 100 G PO SOLR: 1.0000 | Freq: Once | ORAL | Status: DC

## 2011-05-20 NOTE — Patient Instructions (Signed)
You have been scheduled for a Colonoscopy with separate instructions given. Your prep kit has been sent to your pharmacy for you to pick up. We have checked with Dr. Dalene Carrow and they only want a Colonoscopy.

## 2011-05-20 NOTE — Progress Notes (Signed)
  Subjective:    Patient ID: Nancy [name], female    DOB: 28-Mar-1961, 50 y.o.   MRN: [id number]  HPI S/p gastrectomy and chemotherapy for gastric cancer in 2011. Has never had a colonoscopy. Mild intermittent LUQ pain after meals, no other complaints.   Review of Systems All other ROS negative    Objective:   Physical Exam  Constitutional: She is oriented to person, place, and time. She appears well-developed and well-nourished.  Cardiovascular: Normal rate, regular rhythm and normal heart sounds.   No murmur heard. Pulmonary/Chest: Effort normal and breath sounds normal.  Abdominal: Soft. Bowel sounds are normal. She exhibits no distension and no mass. There is no tenderness. There is no rebound and no guarding.       upper midline scar  Musculoskeletal: Tenderness: midline upper scar.  Neurological: She is alert and oriented to person, place, and time.          Assessment & Plan:

## 2011-05-30 ENCOUNTER — Encounter (HOSPITAL_BASED_OUTPATIENT_CLINIC_OR_DEPARTMENT_OTHER): Payer: Managed Care, Other (non HMO) | Admitting: Hematology and Oncology

## 2011-05-30 DIAGNOSIS — Z452 Encounter for adjustment and management of vascular access device: Secondary | ICD-10-CM

## 2011-05-30 DIAGNOSIS — C169 Malignant neoplasm of stomach, unspecified: Secondary | ICD-10-CM

## 2011-06-10 ENCOUNTER — Ambulatory Visit (AMBULATORY_SURGERY_CENTER): Payer: Managed Care, Other (non HMO) | Admitting: Internal Medicine

## 2011-06-10 ENCOUNTER — Encounter: Payer: Self-pay | Admitting: Internal Medicine

## 2011-06-10 VITALS — BP 145/62 | HR 66 | Temp 98.1°F | Resp 18 | Ht 68.0 in | Wt 202.0 lb

## 2011-06-10 DIAGNOSIS — Z1211 Encounter for screening for malignant neoplasm of colon: Secondary | ICD-10-CM

## 2011-06-10 HISTORY — PX: COLONOSCOPY: SHX174

## 2011-06-10 MED ORDER — SODIUM CHLORIDE 0.9 % IV SOLN: 500.0000 mL | INTRAVENOUS | Status: DC

## 2011-06-10 NOTE — Progress Notes (Signed)
Pressure applied to abdomen to reach cecum.  

## 2011-06-10 NOTE — Patient Instructions (Addendum)
The colonoscopy was normal. We thought you were sedated during most of the procedure as it looked like you were asleep. If the medication did not provide the desired effect then I suggest that you have deeper sedation with propofol in the future and I have put that recommendation in your records. You need another routine repeat colonoscopy in 10 years - 2022. Iva Boop, MD, [facility]  Normal colonoscopy Discharge instructions per blue and green sheets Consider propofol sedation( deeper sedation) for future procedures Next screening colonoscopy in 10 years. We will mail you a letter to remind you if this so you can call and schedule this exam.

## 2011-06-11 ENCOUNTER — Telehealth: Payer: Self-pay | Admitting: *Deleted

## 2011-06-11 NOTE — Telephone Encounter (Signed)

## 2011-07-17 ENCOUNTER — Telehealth (INDEPENDENT_AMBULATORY_CARE_PROVIDER_SITE_OTHER): Payer: Self-pay

## 2011-07-17 ENCOUNTER — Other Ambulatory Visit (INDEPENDENT_AMBULATORY_CARE_PROVIDER_SITE_OTHER): Payer: Self-pay | Admitting: General Surgery

## 2011-07-17 ENCOUNTER — Encounter: Payer: Self-pay | Admitting: Internal Medicine

## 2011-07-17 DIAGNOSIS — C169 Malignant neoplasm of stomach, unspecified: Secondary | ICD-10-CM

## 2011-07-17 MED ORDER — PANTOPRAZOLE SODIUM 40 MG PO TBEC: 40.0000 mg | DELAYED_RELEASE_TABLET | Freq: Two times a day (BID) | ORAL | Status: DC

## 2011-07-17 NOTE — Telephone Encounter (Signed)
ERROR

## 2011-08-06 ENCOUNTER — Other Ambulatory Visit: Payer: Self-pay | Admitting: Hematology and Oncology

## 2011-08-06 ENCOUNTER — Encounter (HOSPITAL_BASED_OUTPATIENT_CLINIC_OR_DEPARTMENT_OTHER): Payer: Managed Care, Other (non HMO) | Admitting: Hematology and Oncology

## 2011-08-06 ENCOUNTER — Other Ambulatory Visit (HOSPITAL_COMMUNITY): Payer: BC Managed Care – PPO

## 2011-08-06 DIAGNOSIS — Z9089 Acquired absence of other organs: Secondary | ICD-10-CM

## 2011-08-06 LAB — CBC WITH DIFFERENTIAL/PLATELET
BASO%: 0.6 % (ref 0.0–2.0)
Eosinophils Absolute: 0.1 10*3/uL (ref 0.0–0.5)
MCHC: 33.2 g/dL (ref 31.5–36.0)
MONO#: 0.4 10*3/uL (ref 0.1–0.9)
NEUT#: 4.1 10*3/uL (ref 1.5–6.5)
Platelets: 243 10*3/uL (ref 145–400)
RBC: 4.44 10*6/uL (ref 3.70–5.45)
RDW: 15.3 % — ABNORMAL HIGH (ref 11.2–14.5)
WBC: 6 10*3/uL (ref 3.9–10.3)
lymph#: 1.5 10*3/uL (ref 0.9–3.3)

## 2011-08-06 LAB — LACTATE DEHYDROGENASE: LDH: 204 U/L (ref 94–250)

## 2011-08-06 LAB — CMP (CANCER CENTER ONLY)
ALT(SGPT): 23 U/L (ref 10–47)
Albumin: 3.4 g/dL (ref 3.3–5.5)
CO2: 30 mEq/L (ref 18–33)
Glucose, Bld: 97 mg/dL (ref 73–118)
Potassium: 4.3 mEq/L (ref 3.3–4.7)
Sodium: 140 mEq/L (ref 128–145)
Total Protein: 7.1 g/dL (ref 6.4–8.1)

## 2011-08-06 LAB — CEA: CEA: 0.7 ng/mL (ref 0.0–5.0)

## 2011-08-08 ENCOUNTER — Other Ambulatory Visit (HOSPITAL_COMMUNITY): Payer: Self-pay

## 2011-08-12 ENCOUNTER — Other Ambulatory Visit (HOSPITAL_COMMUNITY): Payer: Self-pay

## 2011-08-16 ENCOUNTER — Encounter (HOSPITAL_BASED_OUTPATIENT_CLINIC_OR_DEPARTMENT_OTHER): Payer: Managed Care, Other (non HMO) | Admitting: Hematology and Oncology

## 2011-08-16 DIAGNOSIS — B3789 Other sites of candidiasis: Secondary | ICD-10-CM

## 2011-08-16 DIAGNOSIS — C169 Malignant neoplasm of stomach, unspecified: Secondary | ICD-10-CM

## 2011-08-16 DIAGNOSIS — Z452 Encounter for adjustment and management of vascular access device: Secondary | ICD-10-CM

## 2011-08-19 NOTE — Progress Notes (Signed)
CC:   Cherylynn Ridges, M.D. Lerry Liner, MD Graylin Shiver, M.D.  IDENTIFYING STATEMENT:  Ms. 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  INTERIM HISTORY:  Ms. Nancy [name] is accompanied today by her husband. She reports since her last clinic visit in June 2012 she has had occasional mild fatigue, but no difficulty with ADLs.  No fevers, chills, or night sweats.  No dyspnea or cough.  She has normal appetite and has had no problems with nausea, vomiting, constipation, or diarrhea.  No dysuria, frequency, or hematuria.  No alteration in sensation or balance or swelling of extremities.  Current medications are reviewed and recorded.  Also of note, she states her last Port-A- Cath flush was on 08/06/2011 when she came in for labs.  She also notes a pruritic patch of skin in the folds of her breast, which she states has been present for about 3 days.  She also states that she has been having a menstrual period with varying flow for the past 3 weeks now and she is not sure if this is related to her being premenopausal.  PHYSICAL EXAMINATION:  Vital Signs:  Temperature is 98.  Heart rate 83. Respirations 20.  Blood pressure 135/85.  Weight 210.2 pounds.  General Appearance:  This is a well-developed, well-nourished, black female in no acute distress.  HEENT:  Sclerae are nonicteric.  There is no oral thrush or mucositis.  Skin:  A patchy erythematous area in between her breasts, consistent with a yeast infection.  Lymphs:  No cervical, supraclavicular, axillary, or inguinal lymphadenopathy.  Cardiac: Regular rate and rhythm without murmurs or gallops.  Peripheral pulses are 2+.  Chest:  She has a left subclavian Port-A-Cath without signs of infection.  Lungs:  Clear to auscultation.  Abdomen:  Positive bowel sounds.  Soft, nontender, and nondistended.  No organomegaly. Extremities:  No edema,  cyanosis, or calf tenderness.  Neuro:  Alert and oriented x3.  Strength, sensation, and coordination are all grossly intact.  LABORATORY DATA:  Laboratory data from 08/06/2011:  CBC with diff reveals white blood count of 6, hemoglobin of 12, hematocrit 36.1, platelets of 243, ANC of 4.1, and MCV of 81.4.  Chemistries reveal a sodium of 140, potassium 4.3, chloride of 100, BUN 22, creatinine of 0.9, glucose of 97, bilirubin 0.6, alkaline phosphatase 66, AST 21, ALT 23, total protein 7.1, albumin 3.4, calcium of 9.2.  tumor marker CEA of 0.7.  IMPRESSION AND PLAN: 1. Nancy [name] is a 50 year old, black female with T3 N2, poorly     differentiated, invasive gastric adenocarcinoma.  She is status     post subtotal gastrectomy on 02/05/2010 with 2/20 positive lymph     nodes found.  She began adjuvant chemotherapy with 5-FU and     leucovorin given daily x5 days between 03/13/2010 and 03/17/2010     followed by external radiation therapy with oral Xeloda between     04/17/2010 and 05/21/2010.  She then resumed chemotherapy with 5-FU     and leucovorin in September 2011, completing 2 additional cycles.     She has had no evidence of disease recurrence since that time.  Her     last colonoscopy under the care of Dr. Stan Head on 06/10/2011     reveal no evidence of malignancy. 2. The patient last had Port-A-Cath flush on 08/06/2011.  We will     continue to maintain port flushes every  2 months. 3. The patient reports that she has had a menstrual period for     approximately 3 weeks now.  She is advised to followup with her     gynecologist for further evaluation and management of this. 4. Patient with erythematous pruritic skin area between her breasts,     consistent with yeast infection.  She is advised that she can use     over-the-counter antifungal cream to the affected area twice daily     until healed.  If no improvement or symptoms worsen over the next 2     weeks, she is to  followup with her primary physician for further     evaluation and management. 5. Per Dr. Dalene Carrow, the patient will be scheduled for a followup visit     in 3 months' time.  A few days before this, we will reassess CBC     with diff, CMET, LDH, CEA, and CT of the chest, abdomen, and pelvis     with contrast.  The patient will also be due for Port-A-Cath flush     at that time.  She is advised to call in the interim for any     questions or problems.  Of note, we had planned to repeat the     patient's CT scan prior to her visit today; however, this scans     were denied by her insurance company.    ______________________________ Sherilyn Banker, MSN, ANP, BC RJ/MEDQ  D:  08/16/2011  T:  08/19/2011  Job:  [id number]

## 2011-08-29 NOTE — Telephone Encounter (Signed)
Close encounter 

## 2011-09-01 ENCOUNTER — Encounter (HOSPITAL_COMMUNITY): Payer: Self-pay

## 2011-09-01 ENCOUNTER — Emergency Department (HOSPITAL_COMMUNITY)
Admission: EM | Admit: 2011-09-01 | Discharge: 2011-09-01 | Disposition: A | Discharge status: Home / Self Care | Payer: Managed Care, Other (non HMO) | Attending: Emergency Medicine | Admitting: Emergency Medicine

## 2011-09-01 DIAGNOSIS — S91012A Laceration without foreign body, left ankle, initial encounter: Secondary | ICD-10-CM

## 2011-09-01 DIAGNOSIS — S81009A Unspecified open wound, unspecified knee, initial encounter: Secondary | ICD-10-CM | POA: Insufficient documentation

## 2011-09-01 DIAGNOSIS — S91009A Unspecified open wound, unspecified ankle, initial encounter: Secondary | ICD-10-CM | POA: Insufficient documentation

## 2011-09-01 DIAGNOSIS — Y93G3 Activity, cooking and baking: Secondary | ICD-10-CM | POA: Insufficient documentation

## 2011-09-01 DIAGNOSIS — W261XXA Contact with sword or dagger, initial encounter: Secondary | ICD-10-CM | POA: Insufficient documentation

## 2011-09-01 DIAGNOSIS — W260XXA Contact with knife, initial encounter: Secondary | ICD-10-CM | POA: Insufficient documentation

## 2011-09-01 MED ORDER — TETANUS-DIPHTH-ACELL PERTUSSIS 5-2.5-18.5 LF-MCG/0.5 IM SUSP
0.5000 mL | Freq: Once | INTRAMUSCULAR | Status: AC
  Administered 2011-09-01: 0.5 mL via INTRAMUSCULAR
  Filled 2011-09-01: qty 0.5

## 2011-09-01 NOTE — ED Provider Notes (Signed)
Medical screening examination/treatment/procedure(s) were performed by non-physician practitioner and as supervising physician I was immediately available for consultation/collaboration.  Zaine Elsass L Ardell Aaronson, MD 09/01/11 2106 

## 2011-09-01 NOTE — ED Provider Notes (Signed)
History    Pt was cooking when accidentally dropped knife on L ankle.  Suffered a puncture wound.  Sts she was bleeding for 30 min.  Now denies significant pain.  Denies numbness.  Does not remember last tetanus shot.  CSN: [id number] Arrival date & time: 09/01/2011  5:17 PM   First MD Initiated Contact with Patient 09/01/11 1806      Chief Complaint  Patient presents with  . Extremity Laceration    pt in with laceration to the left foot states dropped a knife on foot while cooking pt denies pain at present bleeding controlled no apparent distress pt states feeling anxious     (Consider location/radiation/quality/duration/timing/severity/associated sxs/prior treatment) Patient is a 50 y.o. female presenting with skin laceration. The history is provided by the patient. No language interpreter was used.  Laceration  The incident occurred 3 to 5 hours ago. Pain location: left ankle. Size: 0.53mm. The laceration mechanism was a a clean knife. The pain is at a severity of 1/10. The pain is mild. The pain has been improving since onset. She reports no foreign bodies present. Her tetanus status is unknown.    Past Medical History  Diagnosis Date  . 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 Hypertension   . Dermoid cyst of ovary   . Fibroids     Past Surgical History  Procedure Date  . Bilroth ii procedure 02/05/10    adenocarcinoma of antrum, Dr. Lindie Spruce  . Portacath placement 03/01/2010    Dr. Lindie Spruce  . Esophagogastroduodenoscopy 2011    Adenocarcinoma, stomach. Dr. Evette Cristal  . Colonoscopy 06/10/2011    Normal    No family history on file.  History  Substance Use Topics  . Smoking status: Never Smoker   . Smokeless tobacco: Never Used  . Alcohol Use: No    OB History    Grav Para Term Preterm Abortions TAB SAB Ect Mult Living                  Review of Systems  All other systems reviewed and are negative.    Allergies  Niacin  and related  Home Medications   Current Outpatient Rx  Name Route Sig Dispense Refill  . DROSPIRENONE-ESTRADIOL 0.5-1 MG PO TABS Oral Take 1 tablet by mouth daily.      Marland Kitchen FERROUS SULFATE 325 (65 FE) MG PO TABS Oral Take 325 mg by mouth daily with breakfast.      . HYDROCHLOROTHIAZIDE 25 MG PO TABS Oral Take 25 mg by mouth daily.      Marland Kitchen HYDROCODONE-ACETAMINOPHEN 5-500 MG PO TABS Oral Take 1 tablet by mouth 2 (two) times daily.      Marygrace Drought WOMENS PO Oral Take 1 tablet by mouth daily.      Marland Kitchen PANTOPRAZOLE SODIUM 40 MG PO TBEC Oral Take 1 tablet (40 mg total) by mouth 2 (two) times daily. 30 tablet 3  . PRAVASTATIN SODIUM 20 MG PO TABS Oral Take 20 mg by mouth at bedtime.        BP 132/88  Pulse 66  Temp(Src) 98.3 F (36.8 C) (Oral)  Resp 18  SpO2 100%  LMP 08/11/2011  Physical Exam  Constitutional: She is oriented to person, place, and time. She appears well-developed and well-nourished.  HENT:  Head: Normocephalic and atraumatic.  Eyes: Conjunctivae are normal.  Neck: Normal range of motion. Neck supple.  Cardiovascular: Normal rate and regular rhythm.   Pulmonary/Chest:  Effort normal and breath sounds normal.  Musculoskeletal:       L ankle: mildly tender to medial aspect without obvious deformity.  0.47mm puncture wound with scab in place, not actively bleeding.  No fb seen or palpated.  Ankle with FROM.    Normal foot examination  Neurological: She is alert and oriented to person, place, and time.  Skin: Skin is warm and dry.       ED Course  Procedures (including critical care time)  Labs Reviewed - No data to display No results found.   No diagnosis found.    MDM  Pt has a very small puncture wound to L medial ankle without complication or bleeding.  Reassurance given.  TdaP given.  i discuss about risk of infection and what to look for.  Pt voice understanding.          Fayrene Helper, PA 09/01/11 [phone]

## 2011-09-11 ENCOUNTER — Telehealth: Payer: Self-pay | Admitting: *Deleted

## 2011-09-11 NOTE — Telephone Encounter (Signed)
Pt called and stated she has had pain in chest at portacath area.   Now the pain has radiated everywhere.    Pt stated pain worsened when pt takes deep breaths.   Instructed pt to go to ER for further evaluation.   Pt  Stated  " NO ,  I will not go ".   Instructed pt to call surgeon's office  To report symptoms related to portacath.    Pt voiced understanding.

## 2011-09-11 NOTE — Telephone Encounter (Signed)
Spoke with friend  Nancy [name] and informed him of pt's  portacath situation.    Nancy [name] stated he had called surgeon Dr. Dixon Boos office,  And was instructed to have pt go to ER or hospital for further evaluation.    Nancy [name] stated when pt raises her arm,  Then the pain  In chest at port area started.    Pt has appt with Dr. Lindie Spruce   On  10/22/11. Nancy [name] stated he would go to pt's house and encourage  Pt to go to ER for further evaluation. Nancy [name]'s   Phone      [phone].

## 2011-09-13 ENCOUNTER — Emergency Department (HOSPITAL_COMMUNITY)
Admission: EM | Admit: 2011-09-13 | Discharge: 2011-09-13 | Disposition: A | Discharge status: Home / Self Care | Payer: Managed Care, Other (non HMO) | Attending: Emergency Medicine | Admitting: Emergency Medicine

## 2011-09-13 ENCOUNTER — Emergency Department (HOSPITAL_COMMUNITY): Payer: Managed Care, Other (non HMO)

## 2011-09-13 ENCOUNTER — Encounter (HOSPITAL_COMMUNITY): Payer: Self-pay | Admitting: *Deleted

## 2011-09-13 DIAGNOSIS — Z79899 Other long term (current) drug therapy: Secondary | ICD-10-CM | POA: Insufficient documentation

## 2011-09-13 DIAGNOSIS — Z9889 Other specified postprocedural states: Secondary | ICD-10-CM | POA: Insufficient documentation

## 2011-09-13 DIAGNOSIS — R0789 Other chest pain: Secondary | ICD-10-CM

## 2011-09-13 DIAGNOSIS — Z85028 Personal history of other malignant neoplasm of stomach: Secondary | ICD-10-CM | POA: Insufficient documentation

## 2011-09-13 DIAGNOSIS — M25519 Pain in unspecified shoulder: Secondary | ICD-10-CM | POA: Insufficient documentation

## 2011-09-13 DIAGNOSIS — I10 Essential (primary) hypertension: Secondary | ICD-10-CM | POA: Insufficient documentation

## 2011-09-13 DIAGNOSIS — R071 Chest pain on breathing: Secondary | ICD-10-CM | POA: Insufficient documentation

## 2011-09-13 DIAGNOSIS — R079 Chest pain, unspecified: Secondary | ICD-10-CM | POA: Insufficient documentation

## 2011-09-13 LAB — POCT I-STAT, CHEM 8
BUN: 16 mg/dL (ref 6–23)
Calcium, Ion: 1.23 mmol/L (ref 1.12–1.32)
Chloride: 103 mEq/L (ref 96–112)
HCT: 36 % (ref 36.0–46.0)
Sodium: 139 mEq/L (ref 135–145)

## 2011-09-13 LAB — CBC
HCT: 35 % — ABNORMAL LOW (ref 36.0–46.0)
Hemoglobin: 11.4 g/dL — ABNORMAL LOW (ref 12.0–15.0)
MCH: 26.7 pg (ref 26.0–34.0)
MCV: 82 fL (ref 78.0–100.0)
RBC: 4.27 MIL/uL (ref 3.87–5.11)
WBC: 6.1 10*3/uL (ref 4.0–10.5)

## 2011-09-13 LAB — CARDIAC PANEL(CRET KIN+CKTOT+MB+TROPI)
Relative Index: 1.4 (ref 0.0–2.5)
Troponin I: <0.3 ng/mL (ref <0.30)

## 2011-09-13 MED ORDER — IBUPROFEN 800 MG PO TABS: 800.0000 mg | ORAL_TABLET | Freq: Three times a day (TID) | ORAL | Status: AC

## 2011-09-13 MED ORDER — DIAZEPAM 5 MG PO TABS
5.0000 mg | ORAL_TABLET | Freq: Once | ORAL | Status: AC
  Administered 2011-09-13: 5 mg via ORAL
  Filled 2011-09-13: qty 1

## 2011-09-13 MED ORDER — OXYCODONE-ACETAMINOPHEN 5-325 MG PO TABS: 2.0000 | ORAL_TABLET | ORAL | Status: AC | PRN

## 2011-09-13 MED ORDER — DIAZEPAM 5 MG PO TABS: 5.0000 mg | ORAL_TABLET | Freq: Three times a day (TID) | ORAL | Status: AC | PRN

## 2011-09-13 MED ORDER — OXYCODONE-ACETAMINOPHEN 5-325 MG PO TABS
1.0000 | ORAL_TABLET | Freq: Once | ORAL | Status: AC
  Administered 2011-09-13: 1 via ORAL
  Filled 2011-09-13: qty 1

## 2011-09-13 MED ORDER — OXYCODONE-ACETAMINOPHEN 5-325 MG PO TABS
2.0000 | ORAL_TABLET | Freq: Once | ORAL | Status: AC
  Administered 2011-09-13: 1 via ORAL
  Filled 2011-09-13: qty 1

## 2011-09-13 NOTE — ED Provider Notes (Signed)
History     CSN: [id number] Arrival date & time: 09/13/2011 12:43 AM   First MD Initiated Contact with Patient 09/13/11 [phone]      Chief Complaint  Patient presents with  . Chest Pain    related to portacath    (Consider location/radiation/quality/duration/timing/severity/associated sxs/prior treatment) HPI Pt reports onset of pain in her left upper chest starting one week ago.  Pain is sharp, worse with movement and palpation.  Pain has spread from her left chest to her shoulder, neck and right upper back.  Pt associates the pain with her year long portacath.  No sob, no n/v, no nausea, no change in pain with eating/drinking.  Pt was called in vicodin by pcm without improvement in symptoms.  Pt has felt some palpitations at times.  Pain nearly constant over the last week, worsening.  No prior h/o same. Past Medical History  Diagnosis Date  . 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 Hypertension   . Dermoid cyst of ovary   . Fibroids     Past Surgical History  Procedure Date  . Bilroth ii procedure 02/05/10    adenocarcinoma of antrum, Dr. Lindie Spruce  . Portacath placement 03/01/2010    Dr. Lindie Spruce  . Esophagogastroduodenoscopy 2011    Adenocarcinoma, stomach. Dr. Evette Cristal  . Colonoscopy 06/10/2011    Normal    History reviewed. No pertinent family history.  History  Substance Use Topics  . Smoking status: Never Smoker   . Smokeless tobacco: Never Used  . Alcohol Use: No    OB History    Grav Para Term Preterm Abortions TAB SAB Ect Mult Living                  Review of Systems  All other systems reviewed and are negative.    Allergies  Niacin and related  Home Medications   Current Outpatient Rx  Name Route Sig Dispense Refill  . DROSPIRENONE-ESTRADIOL 0.5-1 MG PO TABS Oral Take 1 tablet by mouth daily.      Marland Kitchen FERROUS SULFATE 325 (65 FE) MG PO TABS Oral Take 325 mg by mouth daily with breakfast.      .  HYDROCHLOROTHIAZIDE 25 MG PO TABS Oral Take 25 mg by mouth daily.      Marygrace Drought WOMENS PO Oral Take 1 tablet by mouth daily.      Marland Kitchen PANTOPRAZOLE SODIUM 40 MG PO TBEC Oral Take 1 tablet (40 mg total) by mouth 2 (two) times daily. 30 tablet 3  . PRAVASTATIN SODIUM 20 MG PO TABS Oral Take 20 mg by mouth at bedtime.      Marland Kitchen DIAZEPAM 5 MG PO TABS Oral Take 1 tablet (5 mg total) by mouth every 8 (eight) hours as needed for anxiety (muscle spasm). 15 tablet 0  . IBUPROFEN 800 MG PO TABS Oral Take 1 tablet (800 mg total) by mouth 3 (three) times daily. 21 tablet 0  . OXYCODONE-ACETAMINOPHEN 5-325 MG PO TABS Oral Take 2 tablets by mouth every 4 (four) hours as needed for pain. 20 tablet 0    BP 133/75  Pulse 71  Temp(Src) 97.9 F (36.6 C) (Oral)  Resp 16  SpO2 100%  LMP 08/11/2011  Physical Exam  Nursing note and vitals reviewed. Constitutional: She is oriented to person, place, and time. She appears well-developed and well-nourished.  HENT:  Head: Normocephalic and atraumatic.  Right Ear: External ear normal.  Left Ear: External ear normal.  Nose: Nose normal.  Mouth/Throat: Oropharynx is clear and moist.  Eyes: Conjunctivae and EOM are normal. Pupils are equal, round, and reactive to light.  Neck: Normal range of motion. Neck supple. No JVD present. No tracheal deviation present. No thyromegaly present.       Pain with palpation of left neck along scm, paraspinal muscles  Cardiovascular: Normal rate, regular rhythm, normal heart sounds and intact distal pulses.  Exam reveals no gallop and no friction rub.   No murmur heard. Pulmonary/Chest: Effort normal and breath sounds normal. No stridor. No respiratory distress. She has no wheezes. She has no rales. She exhibits tenderness.       Pain reproducible with palpation of left chest  Abdominal: Soft. Bowel sounds are normal. She exhibits no distension and no mass. There is no tenderness. There is no rebound and no guarding.    Musculoskeletal: Normal range of motion. She exhibits tenderness. She exhibits no edema.       Pain with palpation of right shoulder, no effusion, no crepitus, no limitation in rom  Lymphadenopathy:    She has no cervical adenopathy.  Neurological: She is oriented to person, place, and time. She has normal reflexes. No cranial nerve deficit. She exhibits normal muscle tone. Coordination normal.  Skin: Skin is dry. No rash noted. No erythema. No pallor.  Psychiatric: She has a normal mood and affect. Her behavior is normal. Judgment and thought content normal.    ED Course  Procedures (including critical care time)  Labs Reviewed  CBC - Abnormal; Notable for the following:    Hemoglobin 11.4 (*)    HCT 35.0 (*)    All other components within normal limits  POCT I-STAT, CHEM 8 - Abnormal; Notable for the following:    Glucose, Bld 105 (*)    All other components within normal limits  CARDIAC PANEL(CRET KIN+CKTOT+MB+TROPI)   Dg Chest 2 View  09/13/2011  *RADIOLOGY REPORT*  Clinical Data: Left-sided chest pain.  Sore throat.  CHEST - 2 VIEW  Comparison: Chest radiograph performed 03/01/2010, and CT of the chest performed 12/03/2010  Findings: The lungs are well-aerated and clear.  There is no evidence of focal opacification, pleural effusion or pneumothorax.  The heart is normal in size; the mediastinal contour is within normal limits.  A left chest Mediport is noted ending about the mid SVC.  No acute osseous abnormalities are seen.  IMPRESSION: No acute cardiopulmonary process seen.  Original Report Authenticated By: Tonia Ghent, M.D.    Date: 09/13/2011  Rate: 74   Rhythm: normal sinus rhythm  QRS Axis: right  Intervals: normal  ST/T Wave abnormalities: normal  Conduction Disutrbances:none  Narrative Interpretation:   Old EKG Reviewed: none available    1. Chest wall pain       MDM  50 yo female with chest pain, appears to be msk in origin.  No risk factors for acs/ua.   Pt with h/o cancer, in remission, no current treatment.  No tachycardia, tachypnea, sob, or pleuritic chest pain.  CXR and labwork unremarkable.  Pt feeling better after meds.  Will have her f/u with her pcm for further workup and eval        Olivia Mackie, MD 09/13/11 [phone]

## 2011-09-13 NOTE — ED Notes (Signed)
Waiting for MD evaluation.

## 2011-09-13 NOTE — ED Notes (Signed)
Pt has a portacath that was placed 02/28/2010 for use in the treatment of her stomach cancer.  Pt reports that for the last week, she has experienced pain at the site of her portacath that radiates up to her neck and shoulder.  Pt states that her heart feels as if it is pounding also.

## 2011-09-30 ENCOUNTER — Ambulatory Visit (HOSPITAL_BASED_OUTPATIENT_CLINIC_OR_DEPARTMENT_OTHER): Payer: Managed Care, Other (non HMO)

## 2011-09-30 DIAGNOSIS — C169 Malignant neoplasm of stomach, unspecified: Secondary | ICD-10-CM

## 2011-09-30 DIAGNOSIS — Z469 Encounter for fitting and adjustment of unspecified device: Secondary | ICD-10-CM

## 2011-09-30 MED ORDER — HEPARIN SOD (PORK) LOCK FLUSH 100 UNIT/ML IV SOLN
500.0000 [IU] | Freq: Once | INTRAVENOUS | Status: AC
  Administered 2011-09-30: 500 [IU] via INTRAVENOUS
  Filled 2011-09-30: qty 5

## 2011-09-30 MED ORDER — SODIUM CHLORIDE 0.9 % IJ SOLN
10.0000 mL | INTRAMUSCULAR | Status: DC | PRN
  Administered 2011-09-30: 10 mL via INTRAVENOUS
  Filled 2011-09-30: qty 10

## 2011-10-22 ENCOUNTER — Ambulatory Visit (INDEPENDENT_AMBULATORY_CARE_PROVIDER_SITE_OTHER): Payer: Managed Care, Other (non HMO) | Admitting: General Surgery

## 2011-10-22 ENCOUNTER — Encounter (INDEPENDENT_AMBULATORY_CARE_PROVIDER_SITE_OTHER): Payer: Self-pay | Admitting: General Surgery

## 2011-10-22 VITALS — BP 140/94 | HR 72 | Temp 98.2°F | Resp 20 | Ht 68.0 in | Wt 209.0 lb

## 2011-10-22 DIAGNOSIS — T80212A Local infection due to central venous catheter, initial encounter: Secondary | ICD-10-CM | POA: Insufficient documentation

## 2011-10-22 NOTE — Progress Notes (Signed)
HPI The patient comes in today complaining of pain at the Port-A-Cath site  PE On examination there is no evidence of infection although there does appear to be some mild ecchymosis around the port.  Studiy review None  Assessment Painful Port-A-Cath.  Plan Port-A-Cath in the near future.

## 2011-11-02 ENCOUNTER — Encounter: Payer: Self-pay | Admitting: Hematology and Oncology

## 2011-11-02 NOTE — Progress Notes (Signed)
11/02/2011  Saturday AM  Good Morning Dr. Dalene Carrow, I received VIA fax this morning a denial from [employer] for Mrs. Desire's chest/ abdomen/pelvis CT Scans scheduled for 11/11/2011.  The same information has been mailed to the patient by the insurance company.  Would you like to do a peer-to-peer or for me to cancel these procedures.  From her history unfortunately, scans that were requested by you in October were also denied.  I await your direction with this matter.  Bonita Quin [phone]

## 2011-11-04 ENCOUNTER — Encounter (HOSPITAL_BASED_OUTPATIENT_CLINIC_OR_DEPARTMENT_OTHER)
Admission: RE | Disposition: A | Discharge status: Home / Self Care | Payer: Self-pay | Source: Ambulatory Visit | Attending: General Surgery

## 2011-11-04 ENCOUNTER — Encounter (HOSPITAL_BASED_OUTPATIENT_CLINIC_OR_DEPARTMENT_OTHER): Payer: Self-pay | Admitting: *Deleted

## 2011-11-04 ENCOUNTER — Ambulatory Visit (HOSPITAL_BASED_OUTPATIENT_CLINIC_OR_DEPARTMENT_OTHER)
Admission: RE | Admit: 2011-11-04 | Discharge: 2011-11-04 | Disposition: A | Discharge status: Home / Self Care | Payer: Managed Care, Other (non HMO) | Source: Ambulatory Visit | Attending: General Surgery | Admitting: General Surgery

## 2011-11-04 DIAGNOSIS — I10 Essential (primary) hypertension: Secondary | ICD-10-CM | POA: Insufficient documentation

## 2011-11-04 DIAGNOSIS — C169 Malignant neoplasm of stomach, unspecified: Secondary | ICD-10-CM | POA: Insufficient documentation

## 2011-11-04 DIAGNOSIS — T80212A Local infection due to central venous catheter, initial encounter: Secondary | ICD-10-CM

## 2011-11-04 DIAGNOSIS — Z452 Encounter for adjustment and management of vascular access device: Secondary | ICD-10-CM | POA: Insufficient documentation

## 2011-11-04 HISTORY — PX: PORT-A-CATH REMOVAL: SHX5289

## 2011-11-04 SURGERY — MINOR REMOVAL PORT-A-CATH
Anesthesia: LOCAL | Site: Chest | Laterality: Left | Wound class: Clean

## 2011-11-04 MED ORDER — LIDOCAINE-EPINEPHRINE 2 %-1:100000 IJ SOLN
INTRAMUSCULAR | Status: DC | PRN
  Administered 2011-11-04: 10 mL

## 2011-11-04 SURGICAL SUPPLY — 29 items
BENZOIN TINCTURE PRP APPL 2/3 (GAUZE/BANDAGES/DRESSINGS) IMPLANT
BLADE SURG 15 STRL LF DISP TIS (BLADE) ×1 IMPLANT
BLADE SURG 15 STRL SS (BLADE) ×1
CAUTERY EYE LOW TEMP 1300F FIN (OPHTHALMIC RELATED) IMPLANT
CHLORAPREP W/TINT 26ML (MISCELLANEOUS) ×2 IMPLANT
CLOTH BEACON ORANGE TIMEOUT ST (SAFETY) ×2 IMPLANT
DERMABOND ADVANCED (GAUZE/BANDAGES/DRESSINGS) ×1
DERMABOND ADVANCED .7 DNX12 (GAUZE/BANDAGES/DRESSINGS) ×1 IMPLANT
DRSG TEGADERM 2-3/8X2-3/4 SM (GAUZE/BANDAGES/DRESSINGS) ×2 IMPLANT
DRSG TEGADERM 4X4.75 (GAUZE/BANDAGES/DRESSINGS) IMPLANT
ELECT REM PT RETURN 9FT ADLT (ELECTROSURGICAL)
ELECTRODE REM PT RTRN 9FT ADLT (ELECTROSURGICAL) IMPLANT
GAUZE SPONGE 4X4 12PLY STRL LF (GAUZE/BANDAGES/DRESSINGS) IMPLANT
GLOVE BIOGEL PI IND STRL 8 (GLOVE) IMPLANT
GLOVE BIOGEL PI INDICATOR 8 (GLOVE)
GLOVE ECLIPSE 7.5 STRL STRAW (GLOVE) ×2 IMPLANT
NDL SAFETY ECLIPSE 18X1.5 (NEEDLE) ×1 IMPLANT
NEEDLE HYPO 18GX1.5 SHARP (NEEDLE) ×1
NEEDLE HYPO 30X.5 LL (NEEDLE) ×2 IMPLANT
PENCIL BUTTON HOLSTER BLD 10FT (ELECTRODE) IMPLANT
STRIP CLOSURE SKIN 1/2X4 (GAUZE/BANDAGES/DRESSINGS) ×2 IMPLANT
SUT MNCRL AB 4-0 PS2 18 (SUTURE) ×2 IMPLANT
SUT VIC AB 3-0 54X BRD REEL (SUTURE) IMPLANT
SUT VIC AB 3-0 BRD 54 (SUTURE)
SUT VIC AB 5-0 P-3 18X BRD (SUTURE) IMPLANT
SUT VIC AB 5-0 P3 18 (SUTURE)
SUT VICRYL 4-0 PS2 18IN ABS (SUTURE) IMPLANT
SYR CONTROL 10ML LL (SYRINGE) ×2 IMPLANT
TOWEL OR 17X24 6PK STRL BLUE (TOWEL DISPOSABLE) IMPLANT

## 2011-11-04 NOTE — H&P (Signed)
Nancy [name] is an 51 y.[name]. female.   Chief Complaint: Painful Port-a-cath site HPI: Several weeks of pain at Port-a-cath site,  No fevers or chills Has been functioning well.  Due for likely more therapy.  Past Medical History  Diagnosis Date  . 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 Hypertension   . Dermoid cyst of ovary   . Fibroids     Past Surgical History  Procedure Date  . Bilroth ii procedure 02/05/10    adenocarcinoma of antrum, Dr. Lindie [name]  . Portacath placement 03/01/2010    Dr. Lindie [name]  . Esophagogastroduodenoscopy 2011    Adenocarcinoma, stomach. Dr. Evette [name]  . Colonoscopy 06/10/2011    Normal    No family history on file. Social History:  reports that she has never smoked. She has never used smokeless tobacco. She reports that she does not drink alcohol or use illicit drugs.  Allergies:  Allergies  Allergen Reactions  . Niacin And Related Other (See Comments)    unknown    No current facility-administered medications on file as of .   Medications Prior to Admission  Medication Sig Dispense Refill  . drospirenone-estradiol (ANGELIQ) 0.5-1 MG per tablet Take 1 tablet by mouth daily.        . ferrous sulfate 325 (65 FE) MG tablet Take 325 mg by mouth daily with breakfast.        . hydrochlorothiazide 25 MG tablet Take 25 mg by mouth daily.        . pantoprazole (PROTONIX) 40 MG tablet Take 1 tablet (40 mg total) by mouth 2 (two) times daily.  30 tablet  3  . pravastatin (PRAVACHOL) 20 MG tablet Take 20 mg by mouth at bedtime.          No results found for this or any previous visit (from the past 48 hour(s)). No results found.  Review of Systems  Constitutional: Negative.   HENT: Negative.   Respiratory: Negative.   Cardiovascular: Positive for chest pain (At left [place] site).  Gastrointestinal: Negative.   Genitourinary: Negative.   Musculoskeletal: Negative.   Skin: Negative.   Neurological: Negative.     Endo/Heme/Allergies: Negative.   Psychiatric/Behavioral: Negative.     There were no vitals taken for this visit. Physical Exam  Constitutional: She is oriented to person, place, and time. She appears well-developed and well-nourished.  HENT:  Head: Normocephalic and atraumatic.  Eyes: Conjunctivae and EOM are normal. Pupils are equal, round, and reactive to light.  Neck: Normal range of motion. Neck supple.  Cardiovascular: Normal rate and regular rhythm.   Respiratory: Effort normal and breath sounds normal.    GI: Soft. Bowel sounds are normal.  Genitourinary: Vagina normal.  Musculoskeletal: Normal range of motion.  Neurological: She is alert and oriented to person, place, and time.  Skin: Skin is warm and dry.     Assessment/Plan Painful but functioning Port-a-cath  Plan to remove under local anesthetic.  Nancy [name],Nancy [name] 11/04/2011, 7:43 AM

## 2011-11-04 NOTE — Op Note (Signed)
OPERATIVE REPORT  DATE OF OPERATION: 11/04/2011  PATIENT:  Nancy [name]  51 y.o. female  PRE-OPERATIVE DIAGNOSIS:  painful port a catheter  minor case   POST-OPERATIVE DIAGNOSIS:  Same  PROCEDURE:  Procedure(s): REMOVAL PORT-A-CATH  SURGEON:  Surgeon(s): Cherylynn Ridges III, MD  ASSISTANT: None  ANESTHESIA:   local  EBL: <10 ml  BLOOD ADMINISTERED: none  DRAINS: none   SPECIMEN:  No Specimen  COUNTS CORRECT:  YES  PROCEDURE DETAILS: The patient was taken to the Room # 8 at [facility].  This was a local case only.  A proper timeout was performed. 2% Lidocaine with Epi was injected at the port site using a 25 gauge hypodermic needle.  A 3.5 cm incision was made at the site, the junction of the catheter with the port was identified, The catheter was pulled from the subclavian vein and pressure was applied at the site where the catheter entered the vein.  The port was subsequently dissected from the pocket, then the pocket closed using a running subcuticular stitch of 4-0 Monocryl.  Dermabond, Steri-Strips and Tagaderm were used to complete the dressing.  All counts were correct.  PATIENT DISPOSITION:  PACU - hemodynamically stable.   Lyda Colcord Senn III, O 1/21/20137:00 PM

## 2011-11-05 ENCOUNTER — Encounter (HOSPITAL_BASED_OUTPATIENT_CLINIC_OR_DEPARTMENT_OTHER): Payer: Self-pay

## 2011-11-05 ENCOUNTER — Encounter (HOSPITAL_BASED_OUTPATIENT_CLINIC_OR_DEPARTMENT_OTHER): Payer: Self-pay | Admitting: General Surgery

## 2011-11-11 ENCOUNTER — Encounter: Payer: Self-pay | Admitting: Hematology and Oncology

## 2011-11-11 ENCOUNTER — Ambulatory Visit
Admission: RE | Admit: 2011-11-11 | Discharge: 2011-11-11 | Disposition: A | Discharge status: Home / Self Care | Payer: Managed Care, Other (non HMO) | Source: Ambulatory Visit | Attending: Hematology and Oncology | Admitting: Hematology and Oncology

## 2011-11-11 ENCOUNTER — Other Ambulatory Visit: Payer: Managed Care, Other (non HMO) | Admitting: Lab

## 2011-11-11 DIAGNOSIS — C169 Malignant neoplasm of stomach, unspecified: Secondary | ICD-10-CM

## 2011-11-11 HISTORY — DX: Malignant neoplasm of stomach, unspecified: C16.9

## 2011-11-11 MED ORDER — IOHEXOL 300 MG/ML  SOLN
100.0000 mL | Freq: Once | INTRAMUSCULAR | Status: AC | PRN
  Administered 2011-11-11: 100 mL via INTRAVENOUS

## 2011-11-13 ENCOUNTER — Telehealth: Payer: Self-pay | Admitting: Hematology and Oncology

## 2011-11-13 ENCOUNTER — Other Ambulatory Visit: Payer: Self-pay | Admitting: Nurse Practitioner

## 2011-11-13 NOTE — Telephone Encounter (Signed)
lmonvm for pt re new lab appt for 1/31 @ 1 pm. Also confirmed 2/1 appt.

## 2011-11-14 ENCOUNTER — Other Ambulatory Visit (HOSPITAL_BASED_OUTPATIENT_CLINIC_OR_DEPARTMENT_OTHER): Payer: Managed Care, Other (non HMO) | Admitting: Lab

## 2011-11-14 DIAGNOSIS — C169 Malignant neoplasm of stomach, unspecified: Secondary | ICD-10-CM

## 2011-11-14 DIAGNOSIS — Z452 Encounter for adjustment and management of vascular access device: Secondary | ICD-10-CM

## 2011-11-14 LAB — CBC WITH DIFFERENTIAL/PLATELET
BASO%: 0.9 % (ref 0.0–2.0)
Eosinophils Absolute: 0.2 10*3/uL (ref 0.0–0.5)
HCT: 38 % (ref 34.8–46.6)
LYMPH%: 29.5 % (ref 14.0–49.7)
MCHC: 31.8 g/dL (ref 31.5–36.0)
MONO#: 0.4 10*3/uL (ref 0.1–0.9)
NEUT#: 2.5 10*3/uL (ref 1.5–6.5)
NEUT%: 55.9 % (ref 38.4–76.8)
Platelets: 150 10*3/uL (ref 145–400)
RBC: 4.64 10*6/uL (ref 3.70–5.45)
WBC: 4.4 10*3/uL (ref 3.9–10.3)
lymph#: 1.3 10*3/uL (ref 0.9–3.3)
nRBC: 0 % (ref 0–0)

## 2011-11-14 LAB — COMPREHENSIVE METABOLIC PANEL
ALT: 21 U/L (ref 0–35)
AST: 28 U/L (ref 0–37)
Albumin: 4.7 g/dL (ref 3.5–5.2)
CO2: 25 mEq/L (ref 19–32)
Calcium: 10.1 mg/dL (ref 8.4–10.5)
Chloride: 102 mEq/L (ref 96–112)
Potassium: 5 mEq/L (ref 3.5–5.3)
Total Protein: 7.4 g/dL (ref 6.0–8.3)

## 2011-11-14 LAB — CEA: CEA: 1 ng/mL (ref 0.0–5.0)

## 2011-11-15 ENCOUNTER — Ambulatory Visit: Payer: Managed Care, Other (non HMO) | Admitting: Hematology and Oncology

## 2011-11-15 ENCOUNTER — Telehealth: Payer: Self-pay | Admitting: Nurse Practitioner

## 2011-11-15 NOTE — Telephone Encounter (Signed)
Spoke with patient.  Rescheduled 2/1 appt to 2/4. 

## 2011-11-18 ENCOUNTER — Ambulatory Visit (HOSPITAL_BASED_OUTPATIENT_CLINIC_OR_DEPARTMENT_OTHER): Payer: Managed Care, Other (non HMO) | Admitting: Hematology and Oncology

## 2011-11-18 VITALS — BP 125/83 | HR 74 | Temp 98.3°F | Ht 68.0 in | Wt 205.8 lb

## 2011-11-18 DIAGNOSIS — C169 Malignant neoplasm of stomach, unspecified: Secondary | ICD-10-CM

## 2011-11-18 NOTE — Progress Notes (Signed)
This office note has been dictated.

## 2011-11-18 NOTE — Progress Notes (Signed)
CC:   Lerry Liner, MD Iva Boop, MD,FACG Cherylynn Ridges, M.D.  IDENTIFYING STATEMENT:  The patient is a 51 year old woman with gastric cancer who presents for followup.  INTERVAL HISTORY:  Mr. Grumbine was seen 3 months ago.  She reports a month history of upper respiratory tract infection.  She has been on several courses of antibiotics.  She is febrile.  She has not lost weight.  Denies abdominal pain.  She denies rectal bleeding.  She is working.  She had a port removed late last year.  I reviewed results of CT of chest, abdomen and pelvis on 11/03/2011.  There was a new 4 mm left upper lobe pulmonary nodule with no other evidence of disease in the chest.  Is was suggested that this could represent a focus of inflammation and followup chest CT scan was recommended in 3 months' time.  Abdomen and pelvis was unremarkable for metastatic disease.   There was no adenopathy.  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   There was no evidence of small bowel thickening.  Dr. Leone Payor performed a colonoscopy on 06/10/2011 that showed no evidence for malignancy.  MEDICATIONS:  Reviewed and updated.  ALLERGIES:  Niacin.  Past medical history, family history, social history unchanged.  REVIEW OF SYSTEMS:  Ten point review of systems negative.  PHYSICAL EXAMINATION:  General:  The patient is a well-appearing, well- nourished woman in no distress.  Vitals:  Pulse 74, blood pressure 125/83, temperature 98.2, respirations 20, weight 205 pounds.  HEENT: Head is atraumatic, normocephalic.  Sclerae anicteric.  Mouth moist. Neck:  Supple.  Chest:  Clear to percussion and auscultation.  CVS: Unremarkable.  Abdomen:  Obese, soft, nontender.  No masses palpable. No hepatosplenomegaly.  Bowel sounds present.  Extremities:  No calf pain.  Pulses present and symmetrical.  LAB DATA:  On 11/14/2011 white cell count 4.4, hemoglobin 12.1, hematocrit 38, platelets 150.  Sodium 140, potassium  5, chloride 102, CO2 25, BUN 11, creatinine 0.86, glucose 90, T bilirubin 0.4, alkaline phosphatase 110, AST 28, ALT 21.  IMPRESSION AND PLAN:  Ms. 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  She completed adjuvant chemotherapy with 5-FU and leucovorin given daily for 5 days between 03/13/2010 and 03/17/2010 followed by external radiation therapy with oral Xeloda between 04/17/2010 and 05/21/2010.  She then resumed chemotherapy with 5-FU and leucovorin in 06/2010 completing 2 additional cycles.  Her last colonoscopy with Dr. Stan Head was 06/10/2011.  Her current CTs indicate no evidence of recurrence, however, there is a new left lower lobe nodule that is felt to be inflammatory versus infectious.  With this said, I will have Ms. Leaf follow up with a chest CT scan in 3 months' time.  If all is  well she will routinely f/u in 6 months' time with blood work.  I have also referred her to Dr. Marvell Fuller office for an endoscopy.    ______________________________ Laurice Record, M.D. LIO/MEDQ  D:  11/18/2011  T:  11/18/2011  Job:  [id number]

## 2011-11-19 ENCOUNTER — Telehealth (INDEPENDENT_AMBULATORY_CARE_PROVIDER_SITE_OTHER): Payer: Self-pay | Admitting: General Surgery

## 2011-11-19 NOTE — Progress Notes (Signed)
She has been scheduled for an office visit but could get direct EGD (with propofol) to follow-up gastric cancer. I think she may need propofol - you can ask her - seemed like she did not have great sedation effect with moderate at colonoscopy.

## 2011-11-19 NOTE — Telephone Encounter (Signed)
Patient still complaining of pain at port a cath site. No redness/ drainage. Pain does not travel anywhere, just right at port site. Tylenol and Ibuprofen not helping. Please advise.

## 2011-11-20 ENCOUNTER — Telehealth: Payer: Self-pay

## 2011-11-20 NOTE — Telephone Encounter (Signed)
Patient is scheduled for a direct endo on 12/30/11 and pre-visit on 12/16/11.  I have canceled her office visit with Dr Leone Payor

## 2011-11-20 NOTE — Telephone Encounter (Signed)
Message copied by Annett Fabian on Wed Nov 20, 2011  9:32 AM ------      Message from: Iva Boop      Created: Tue Nov 19, 2011  4:50 PM       Moderate sedation is ok            ----- Message -----         From: Rossie Muskrat, RN,CGRN         Sent: 11/19/2011   3:25 PM           To: Iva Boop, MD            Dr Leone Payor you don't have propofol again until 3/14 and she can only come on a Mon due to transportation issues.  She doesn't remember anything negative about her procedure in August .  Is it ok to schedule for a Mon with conscious sedation?

## 2011-11-22 ENCOUNTER — Telehealth (INDEPENDENT_AMBULATORY_CARE_PROVIDER_SITE_OTHER): Payer: Self-pay

## 2011-11-22 NOTE — Telephone Encounter (Signed)
Patient told to continue tylenol and pain should get better.

## 2011-11-26 ENCOUNTER — Telehealth (INDEPENDENT_AMBULATORY_CARE_PROVIDER_SITE_OTHER): Payer: Self-pay

## 2011-11-26 NOTE — Telephone Encounter (Signed)
Patient seeking a refill of Protonix 40 mg. She is currently out of  Protonix .  Her pharmacy is [facility] on [address] [phone].

## 2011-11-29 ENCOUNTER — Ambulatory Visit: Payer: Managed Care, Other (non HMO) | Admitting: Internal Medicine

## 2011-12-16 ENCOUNTER — Ambulatory Visit: Payer: Managed Care, Other (non HMO) | Admitting: Internal Medicine

## 2011-12-16 ENCOUNTER — Ambulatory Visit (AMBULATORY_SURGERY_CENTER): Payer: Managed Care, Other (non HMO) | Admitting: *Deleted

## 2011-12-16 VITALS — Ht 68.0 in | Wt 210.5 lb

## 2011-12-16 DIAGNOSIS — C169 Malignant neoplasm of stomach, unspecified: Secondary | ICD-10-CM

## 2011-12-16 NOTE — Progress Notes (Signed)
Brother will come with patient to interpret day of procedure. Ezra Sites

## 2011-12-30 ENCOUNTER — Ambulatory Visit (AMBULATORY_SURGERY_CENTER): Payer: Managed Care, Other (non HMO) | Admitting: Internal Medicine

## 2011-12-30 ENCOUNTER — Other Ambulatory Visit: Payer: Managed Care, Other (non HMO) | Admitting: Internal Medicine

## 2011-12-30 ENCOUNTER — Encounter: Payer: Self-pay | Admitting: Internal Medicine

## 2011-12-30 VITALS — BP 141/81 | HR 66 | Temp 97.2°F | Resp 18 | Ht 68.0 in | Wt 205.0 lb

## 2011-12-30 DIAGNOSIS — Z85028 Personal history of other malignant neoplasm of stomach: Secondary | ICD-10-CM

## 2011-12-30 DIAGNOSIS — C169 Malignant neoplasm of stomach, unspecified: Secondary | ICD-10-CM

## 2011-12-30 MED ORDER — SODIUM CHLORIDE 0.9 % IV SOLN: 500.0000 mL | INTRAVENOUS | Status: DC

## 2011-12-30 NOTE — Op Note (Signed)
[facility] [address] [place], [place]  [address]  ENDOSCOPY PROCEDURE REPORT  [name]:  Nancy [name], Nancy [name]  MR#:  [id number] BIRTHDATE:  Feb 10, 1961, 50 yrs. old  GENDER:  female  ENDOSCOPIST:  Iva Boop, MD, [facility]  PROCEDURE DATE:  12/30/2011 PROCEDURE:  EGD, diagnostic [id number] ASA CLASS:  Class II INDICATIONS:  Follow-up of gastric cancer resected May 2011 and followed by chemotherapy  MEDICATIONS:   These medications were titrated to [name] response per physician's verbal order, Fentanyl 50 mcg IV, Versed 5 mg IV TOPICAL ANESTHETIC:  Cetacaine Spray  DESCRIPTION OF PROCEDURE:   After Nancy risks benefits and alternatives of Nancy procedure were thoroughly explained, informed consent was obtained.  Nancy LB GIF-H180 [id number] endoscope was introduced through Nancy mouth and advanced into both limbs of Nancy small bowel, without limitations.  Nancy instrument was slowly withdrawn as Nancy mucosa was fully examined. <<PROCEDUREIMAGES>>  This was a normal post-op examination, s/p Bilroth II. No evidence of recurrent tumor. There was typical erythema of gastric pouch s/p Bilroth II.   Retroflexed views revealed no abnormalities. Nancy scope was then withdrawn from Nancy [name] and Nancy procedure completed.  COMPLICATIONS:  None  ENDOSCOPIC IMPRESSION: 1) Normal post-op examination - no evidence of recurrent gastric cancer RECOMMENDATIONS: Follow-up and repeat EGD per Dr. Everardo Beals, MD, [facility]  CC:  Arlan Organ, MD and Nancy [name]  n. eSIGNED:   Iva Boop at 12/30/2011 05:51 PM  Todisco, Hilda Lias, [phone]

## 2011-12-30 NOTE — Patient Instructions (Signed)
YOU HAD AN ENDOSCOPIC PROCEDURE TODAY AT THE [facility]: Refer to the procedure report that was given to you for any specific questions about what was found during the examination.  If the procedure report does not answer your questions, please call your gastroenterologist to clarify.  If you requested that your care partner not be given the details of your procedure findings, then the procedure report has been included in a sealed envelope for you to review at your convenience later.  YOU SHOULD EXPECT: Some feelings of bloating in the abdomen. Passage of more gas than usual.  Walking can help get rid of the air that was put into your GI tract during the procedure and reduce the bloating. If you had a lower endoscopy (such as a colonoscopy or flexible sigmoidoscopy) you may notice spotting of blood in your stool or on the toilet paper. If you underwent a bowel prep for your procedure, then you may not have a normal bowel movement for a few days.  DIET: Your first meal following the procedure should be a light meal and then it is ok to progress to your normal diet.  A half-sandwich or bowl of soup is an example of a good first meal.  Heavy or fried foods are harder to digest and may make you feel nauseous or bloated.  Likewise meals heavy in dairy and vegetables can cause extra gas to form and this can also increase the bloating.  Drink plenty of fluids but you should avoid alcoholic beverages for 24 hours.  ACTIVITY: Your care partner should take you home directly after the procedure.  You should plan to take it easy, moving slowly for the rest of the day.  You can resume normal activity the day after the procedure however you should NOT DRIVE or use heavy machinery for 24 hours (because of the sedation medicines used during the test).    SYMPTOMS TO REPORT IMMEDIATELY: A gastroenterologist can be reached at any hour.  During normal business hours, 8:30 AM to 5:00 PM Monday through Friday,  call [phone].  After hours and on weekends, please call the GI answering service at [phone] who will take a message and have the physician on call contact you.  Following upper endoscopy (EGD)  Vomiting of blood or coffee ground material  New chest pain or pain under the shoulder blades  Painful or persistently difficult swallowing  New shortness of breath  Fever of 100F or higher  Black, tarry-looking stools  FOLLOW UP: If any biopsies were taken you will be contacted by phone or by letter within the next 1-3 weeks.  Call your gastroenterologist if you have not heard about the biopsies in 3 weeks.  Our staff will call the home number listed on your records the next business day following your procedure to check on you and address any questions or concerns that you may have at that time regarding the information given to you following your procedure. This is a courtesy call and so if there is no answer at the home number and we have not heard from you through the emergency physician on call, we will assume that you have returned to your regular daily activities without incident.  SIGNATURES/CONFIDENTIALITY: You and/or your care partner have signed paperwork which will be entered into your electronic medical record.  These signatures attest to the fact that that the information above on your After Visit Summary has been reviewed and is understood.  Full responsibility of   the confidentiality of this discharge information lies with you and/or your care-partner. 

## 2011-12-30 NOTE — Progress Notes (Signed)
Patient did not have preoperative order for IV antibiotic SSI prophylaxis. (G8918)  Patient did not experience any of the following events: a burn prior to discharge; a fall within the facility; wrong site/side/patient/procedure/implant event; or a hospital transfer or hospital admission upon discharge from the facility. (G8907)  

## 2011-12-30 NOTE — Progress Notes (Addendum)
Unsuccessful IV attempts x 6.   R hand x1 by NS L hand and AC by RM L hand, AC and R wrist by Manuela Neptune, CRNA

## 2011-12-31 ENCOUNTER — Telehealth: Payer: Self-pay

## 2011-12-31 NOTE — Telephone Encounter (Signed)
  Follow up Call-  Call back number 12/30/2011 06/10/2011  Post procedure Call Back phone  # [phone]  Permission to leave phone message Yes -     Patient questions:  Do you have a fever, pain , or abdominal swelling? no Pain Score  0 *  Have you tolerated food without any problems? yes  Have you been able to return to your normal activities? yes  Do you have any questions about your discharge instructions: Diet   no Medications  no Follow up visit  no  Do you have questions or concerns about your Care? no  Actions: * If pain score is 4 or above: No action needed, pain <4.

## 2012-01-02 ENCOUNTER — Telehealth (INDEPENDENT_AMBULATORY_CARE_PROVIDER_SITE_OTHER): Payer: Self-pay

## 2012-01-02 IMAGING — CT CT ABD-PELV W/ CM
3 of 5 series · 12 of 36 positions shown, 18 images · IV contrast (READICAT/WATER & [ID] OMNI 300)
Comparison: None.

CLINICAL DATA: Abnormal endoscopy, anemia, fatigue, endoscopy shows
invasive adenocarcinoma

CT ABDOMEN AND PELVIS WITH CONTRAST
TECHNIQUE: Multidetector CT imaging of the abdomen and pelvis was
performed following the standard protocol during bolus
administration of intravenous contrast.
Contrast: 125 ml Bmnipaque-AHH

[Series 3: routine abdomen · axial · 0.74mm/px · z∈[-355,-10]mm · 7 of 85 slices shown, 12 images]
[im 11/85  soft-tissue]
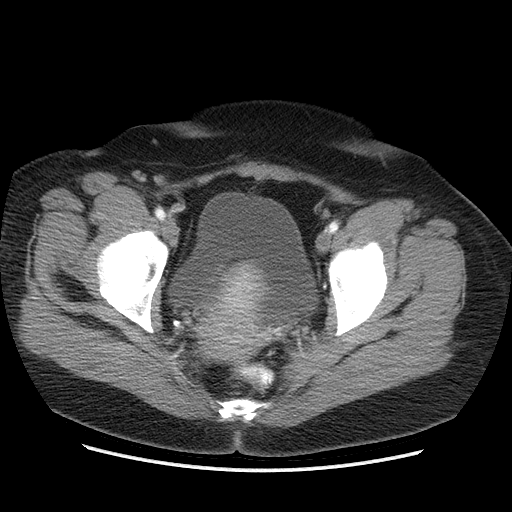
[im 11/85  bone]
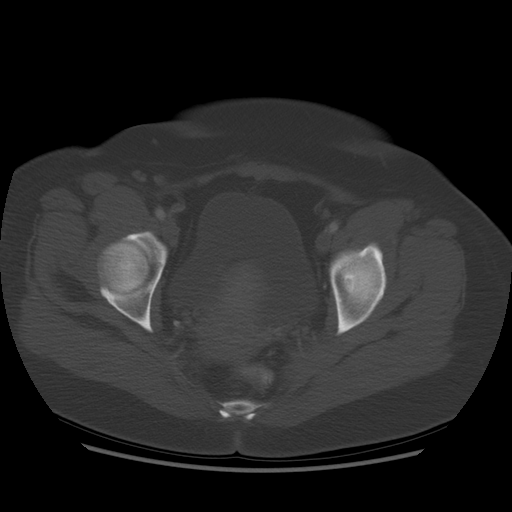
[im 22/85  soft-tissue]
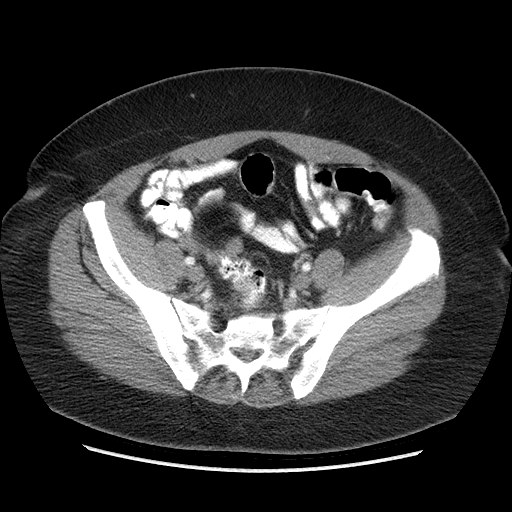
[im 32/85  soft-tissue]
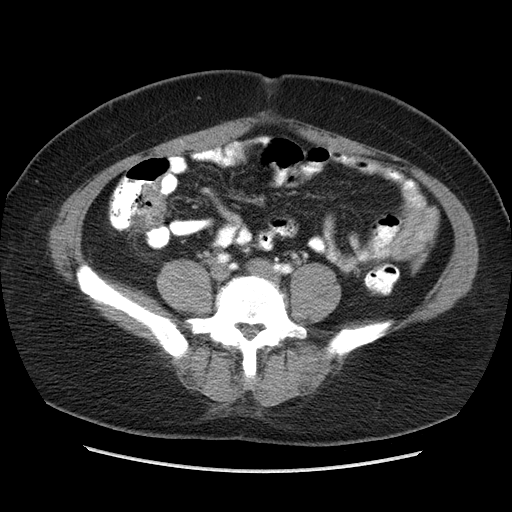
[im 43/85  soft-tissue]
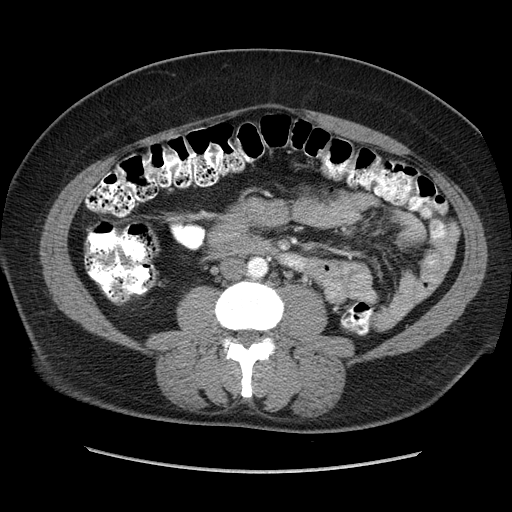
[im 43/85  lung]
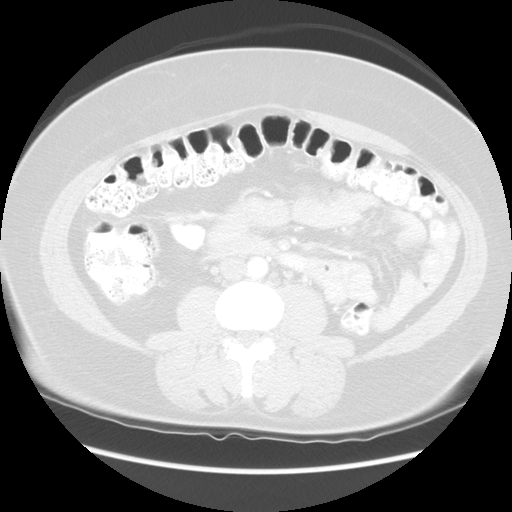
[im 53/85  soft-tissue]
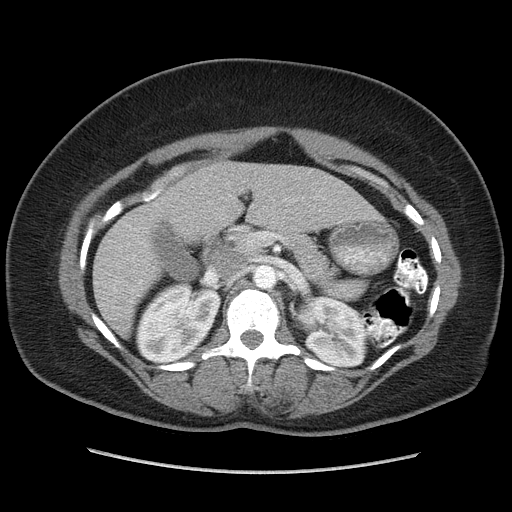
[im 53/85  lung]
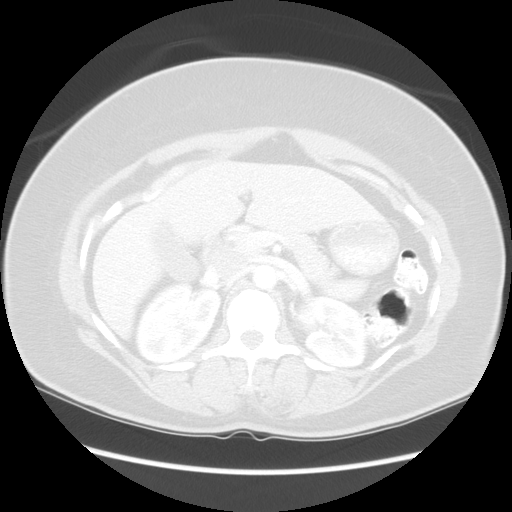
[im 64/85  soft-tissue]
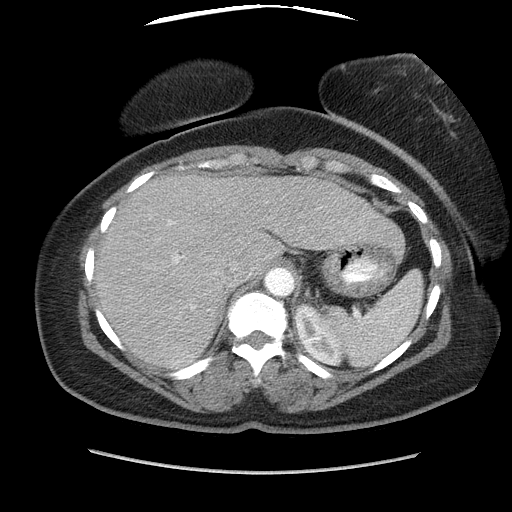
[im 64/85  lung]
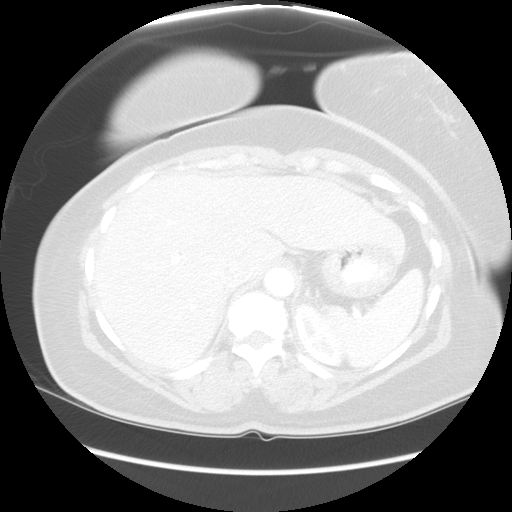
[im 74/85  soft-tissue]
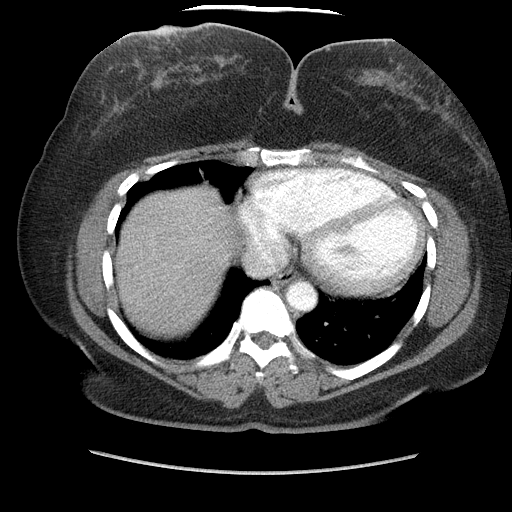
[im 74/85  lung]
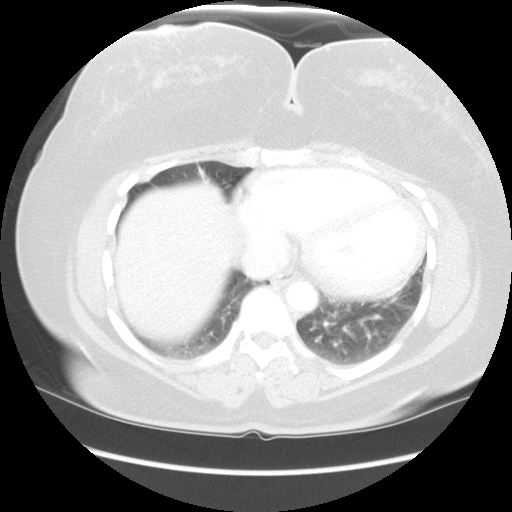

[Series 601: coronal body · coronal · 1.01mm/px · 1 of 132 slices shown, 2 images]
[im 44/132  soft-tissue]
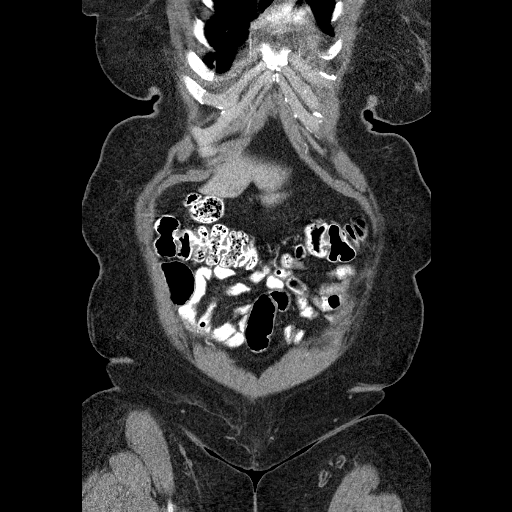
[im 44/132  bone]
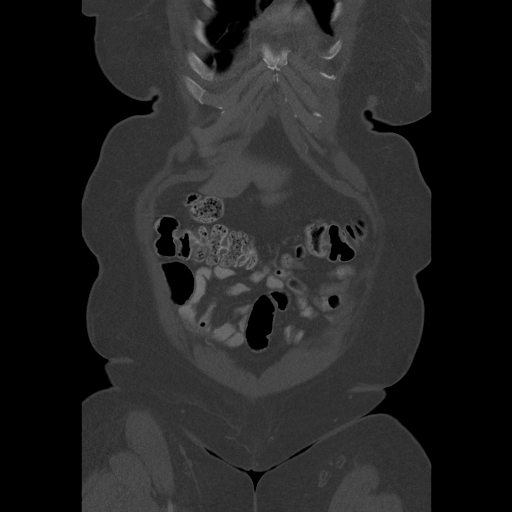

[Series 602: sagittal body · sagittal · 1.01mm/px · 4 of 152 slices shown]
[im 10/152  soft-tissue]
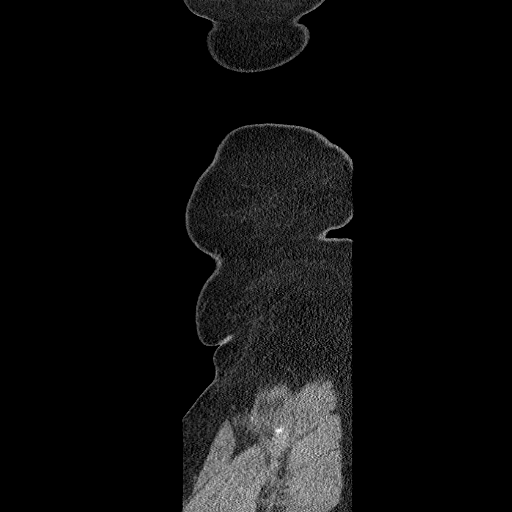
[im 29/152  soft-tissue]
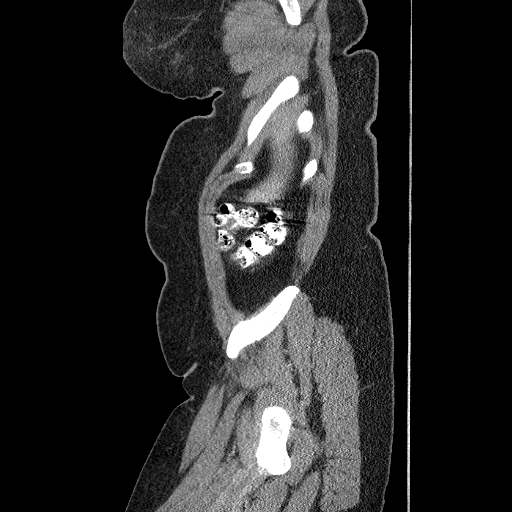
[im 48/152  soft-tissue]
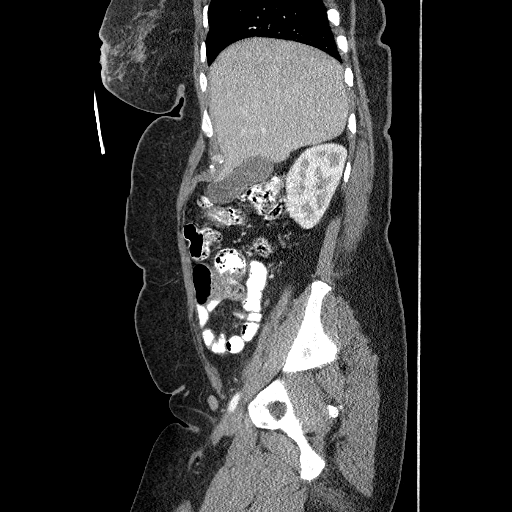
[im 67/152  soft-tissue]
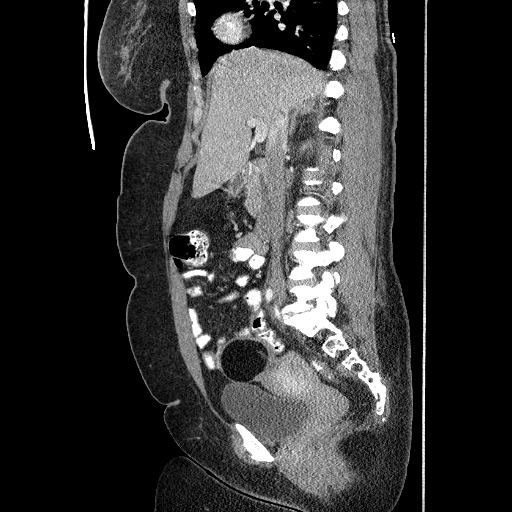

[12 of 36 positions shown; findings below may reference images not displayed]

FINDINGS: The lung bases are clear.  The liver is somewhat
inhomogeneous but no definite intrahepatic lesion is seen.  No
ductal dilatation is noted.  No calcified gallstones are seen.  The
pancreas is normal in size and the pancreatic duct is not dilated.
The adrenal glands and spleen are unremarkable.  The stomach is not
well distended.  I do not have the history as to the site of
invasive adenocarcinoma.  There is a focus of somewhat thickened
mucosa within the body of the stomach along the greater curvature
and a lesion at that site cannot be excluded. No evidence of
extruded gastric soft tissue mass is seen and no adjacent
adenopathy is noted. The remainder of the stomach is unremarkable.
The kidneys enhance with no focal abnormality and no ductal
dilatation is seen.  The abdominal aorta is normal in caliber.  No
adenopathy is seen.

The uterus is normal in size with low attenuation centrally.  Is
the patient post menopausal?  If so than pelvic ultrasound may be
warranted.  There is a large fat-containing mass within the right
adnexa measuring 53 x 51 mm consistent with right ovarian dermoid.
Again pelvic ultrasound may be helpful.  The left ovary is
unremarkable.  No free fluid is seen within the pelvis.  The
urinary bladder is not optimally distended but is unremarkable.  No
pelvic fluid is seen.  No abnormality of the colon is evident by
CT. Degenerative disc disease is present at L5-S1.
IMPRESSION: 1.  There is a soft tissue thickening in the distal body of the
stomach which appears to be along the greater curvature and a
gastric mass cannot be excluded.  No extra gastric soft tissue mass
or adenopathy is seen.
2.  No abnormality of the liver is noted although the liver is
slightly inhomogeneous.
3.  No abnormality of the colon is seen.
4.  53 x 51 mm fat containing mass in the right adnexa consistent
with right ovarian dermoid.  With low attenuation centrally within
the uterus, a pelvic ultrasound may be helpful to evaluate both of
these areas further.

## 2012-01-02 NOTE — Telephone Encounter (Signed)
I spoke with the patient this morning.  She is complaining of constant discomfort at her [facility] site.  I spoke with Marcelino Duster, Dr. Dixon Boos nurse, who informed me the patient has had long term problems at this site.  I offered the patient the first available appt with Dr. Lindie Spruce which she declined because it was too far out.  I advised her to take her Tylenol this weekend, and call us again on Monday morning to be reevaluated.  The patient agreed.

## 2012-01-06 ENCOUNTER — Telehealth (INDEPENDENT_AMBULATORY_CARE_PROVIDER_SITE_OTHER): Payer: Self-pay | Admitting: General Surgery

## 2012-01-06 NOTE — Telephone Encounter (Signed)
The patient called stating that she is having pain still after her port a cath removal. Patient was given an appt on 02/11/12 but desires to be seen sooner, pt advised to continue with Tylenol as directed previously and a note would be forwarded to Dr Lindie Spruce and his CMA

## 2012-01-06 NOTE — Telephone Encounter (Signed)
No time available any sooner unless someone cancels.

## 2012-01-08 ENCOUNTER — Other Ambulatory Visit (INDEPENDENT_AMBULATORY_CARE_PROVIDER_SITE_OTHER): Payer: Self-pay

## 2012-01-08 DIAGNOSIS — L7682 Other postprocedural complications of skin and subcutaneous tissue: Secondary | ICD-10-CM

## 2012-01-09 ENCOUNTER — Other Ambulatory Visit: Payer: Managed Care, Other (non HMO)

## 2012-02-11 ENCOUNTER — Encounter (INDEPENDENT_AMBULATORY_CARE_PROVIDER_SITE_OTHER): Payer: Managed Care, Other (non HMO) | Admitting: General Surgery

## 2012-02-17 ENCOUNTER — Other Ambulatory Visit: Payer: Managed Care, Other (non HMO)

## 2012-02-19 ENCOUNTER — Other Ambulatory Visit (HOSPITAL_COMMUNITY): Payer: Self-pay | Admitting: Specialist

## 2012-02-19 DIAGNOSIS — Z1231 Encounter for screening mammogram for malignant neoplasm of breast: Secondary | ICD-10-CM

## 2012-03-23 ENCOUNTER — Ambulatory Visit (HOSPITAL_COMMUNITY)
Admission: RE | Admit: 2012-03-23 | Discharge: 2012-03-23 | Disposition: A | Discharge status: Home / Self Care | Payer: Managed Care, Other (non HMO) | Source: Ambulatory Visit | Attending: Specialist | Admitting: Specialist

## 2012-03-23 DIAGNOSIS — Z1231 Encounter for screening mammogram for malignant neoplasm of breast: Secondary | ICD-10-CM | POA: Insufficient documentation

## 2012-04-07 ENCOUNTER — Other Ambulatory Visit (INDEPENDENT_AMBULATORY_CARE_PROVIDER_SITE_OTHER): Payer: Self-pay

## 2012-04-07 DIAGNOSIS — C169 Malignant neoplasm of stomach, unspecified: Secondary | ICD-10-CM

## 2012-04-07 NOTE — Telephone Encounter (Signed)
The patient called requesting a refill on Pantoprazole 40mg .  She has an appt on July 23rd.  She uses [facility] on [address]  Please let the pt know on Wednesday when this is called in .

## 2012-04-08 ENCOUNTER — Other Ambulatory Visit (INDEPENDENT_AMBULATORY_CARE_PROVIDER_SITE_OTHER): Payer: Self-pay

## 2012-04-08 DIAGNOSIS — C169 Malignant neoplasm of stomach, unspecified: Secondary | ICD-10-CM

## 2012-04-08 MED ORDER — PANTOPRAZOLE SODIUM 40 MG PO TBEC: 40.0000 mg | DELAYED_RELEASE_TABLET | Freq: Two times a day (BID) | ORAL | Status: DC

## 2012-04-08 NOTE — Addendum Note (Signed)
Addended by: Maryan Puls on: 04/08/2012 04:54 PM   Modules accepted: Orders

## 2012-04-08 NOTE — Telephone Encounter (Signed)
Paged Dr. Lindie Spruce -- okay to refill Pantoprazole 40mg , patient will need to follow up with her PCP for future refills.

## 2012-05-05 ENCOUNTER — Ambulatory Visit (INDEPENDENT_AMBULATORY_CARE_PROVIDER_SITE_OTHER): Payer: Managed Care, Other (non HMO) | Admitting: General Surgery

## 2012-05-05 ENCOUNTER — Encounter (INDEPENDENT_AMBULATORY_CARE_PROVIDER_SITE_OTHER): Payer: Self-pay | Admitting: General Surgery

## 2012-05-05 VITALS — BP 124/84 | HR 72 | Temp 97.4°F | Resp 14 | Ht 68.0 in | Wt 215.8 lb

## 2012-05-05 DIAGNOSIS — C169 Malignant neoplasm of stomach, unspecified: Secondary | ICD-10-CM

## 2012-05-05 MED ORDER — PANTOPRAZOLE SODIUM 40 MG PO TBEC: 40.0000 mg | DELAYED_RELEASE_TABLET | Freq: Two times a day (BID) | ORAL | Status: DC

## 2012-05-05 NOTE — Progress Notes (Signed)
HPI The patient comes in complaining of pain in her left port site just above her left breast. She develops pain when she tries to lift her left arm a past 90 of abduction  PE On examination she does have a small amount of scarring tissue pulling which he abducts her left arm. There is no palpable mass in the previous port site.  Studiy review I reviewed the CT scan done of the patient's chest several days postop after removal of her Port-A-Cath. This demonstrated some fluid accumulation in the area of the previous port but no retained foreign body.  Assessment Likely pain secondary to scar tissue at the site of her previous Port-A-Cath.  Plan I've recommended some physical therapy including walking up a wall using a left arm in order to slowly stretch out the area of scar tissue on the left chest wall. She is to return to see me on a p.r.n. basis. And I released her back to her primary care physician.

## 2012-05-26 ENCOUNTER — Telehealth (INDEPENDENT_AMBULATORY_CARE_PROVIDER_SITE_OTHER): Payer: Self-pay | Admitting: General Surgery

## 2012-05-26 NOTE — Telephone Encounter (Signed)
Pt calling to discuss ongoing pain at site of PAC removal.  She was instructed to perform physical therapy (self-directed at home) to stretch out the scar tissue which is causing pain.  Dr. Lindie Spruce released her to the PCP; she went to PCP and got a Rx for meds she cannot afford, so did not get.  Pt instructed today to call PCP and explain she cannot afford the med ordered and request alternative.

## 2012-05-27 ENCOUNTER — Encounter: Payer: Self-pay | Admitting: Hematology and Oncology

## 2012-05-27 ENCOUNTER — Other Ambulatory Visit: Payer: Managed Care, Other (non HMO)

## 2012-05-27 ENCOUNTER — Telehealth: Payer: Self-pay | Admitting: Hematology and Oncology

## 2012-05-27 ENCOUNTER — Ambulatory Visit: Payer: Managed Care, Other (non HMO) | Admitting: Hematology and Oncology

## 2012-05-27 ENCOUNTER — Other Ambulatory Visit (HOSPITAL_BASED_OUTPATIENT_CLINIC_OR_DEPARTMENT_OTHER): Payer: Managed Care, Other (non HMO) | Admitting: Lab

## 2012-05-27 ENCOUNTER — Ambulatory Visit (HOSPITAL_BASED_OUTPATIENT_CLINIC_OR_DEPARTMENT_OTHER): Payer: Managed Care, Other (non HMO) | Admitting: Hematology and Oncology

## 2012-05-27 VITALS — BP 113/67 | HR 68 | Temp 97.6°F | Resp 20 | Ht 68.0 in | Wt 213.6 lb

## 2012-05-27 DIAGNOSIS — C169 Malignant neoplasm of stomach, unspecified: Secondary | ICD-10-CM

## 2012-05-27 DIAGNOSIS — C779 Secondary and unspecified malignant neoplasm of lymph node, unspecified: Secondary | ICD-10-CM

## 2012-05-27 DIAGNOSIS — R911 Solitary pulmonary nodule: Secondary | ICD-10-CM

## 2012-05-27 LAB — COMPREHENSIVE METABOLIC PANEL
BUN: 19 mg/dL (ref 6–23)
CO2: 26 mEq/L (ref 19–32)
Creatinine, Ser: 1.02 mg/dL (ref 0.50–1.10)
Glucose, Bld: 91 mg/dL (ref 70–99)
Total Bilirubin: 0.4 mg/dL (ref 0.3–1.2)
Total Protein: 7.1 g/dL (ref 6.0–8.3)

## 2012-05-27 LAB — CBC WITH DIFFERENTIAL/PLATELET
Basophils Absolute: 0 10*3/uL (ref 0.0–0.1)
Eosinophils Absolute: 0.1 10*3/uL (ref 0.0–0.5)
HCT: 35.1 % (ref 34.8–46.6)
LYMPH%: 34.2 % (ref 14.0–49.7)
MONO#: 0.4 10*3/uL (ref 0.1–0.9)
NEUT#: 2.5 10*3/uL (ref 1.5–6.5)
NEUT%: 54.2 % (ref 38.4–76.8)
Platelets: 246 10*3/uL (ref 145–400)
WBC: 4.5 10*3/uL (ref 3.9–10.3)

## 2012-05-27 LAB — CEA: CEA: 1 ng/mL (ref 0.0–5.0)

## 2012-05-27 LAB — VITAMIN B12: Vitamin B-12: >2000 pg/mL — ABNORMAL HIGH (ref 211–911)

## 2012-05-27 NOTE — Progress Notes (Signed)
Patient ID: Nancy [name], female   DOB: 01/15/1961, 51 y.o.   MRN: [id number] CSN: [id number]  CC: Nancy Liner, MD  Nancy Boop, MD,FACG  Cherylynn Ridges, M.D.  Identifying Statement: Nancy [name] is a 51 y.o. African-American female with gastric cancer who presents for follow-up.  Interval History: Mr. Gajda was seen 6 months ago. She reports a recent history of cardiac palpitations, diaphoresis (isolated between her neck and abdominal area) and intermittent back pain. The patient also state that she has pain in the area where her Port-A-Cath was removed.  The patient denies abdominal pain, rectal bleeding, redness and any fevers.  The patient continues to work but does admit that her job is quite stressful. Dr. Leone Payor performed a colonoscopy on 06/10/2011 that showed no evidence for malignancy.  Dr. Leone Payor performed an EGD on 12/30/2011 s/p Bilroth II, that showed no evidence of recurrent tumor. The patient had a mammogram on 03/23/2012 that showed no evidence of malignancy.  Overall the patient states that she feels well and denies any other symptomatology.  Medications: Current Outpatient Prescriptions  Medication Sig Dispense Refill  . acetaminophen-codeine (TYLENOL #3) 300-30 MG per tablet Take 1 tablet by mouth 2 (two) times daily as needed.      Marland Kitchen estrogen, conjugated,-medroxyprogesterone (PREMPRO) 0.625-2.5 MG per tablet Take 1 tablet by mouth daily.      . Iron-Folic Acid-Vit B12 (IRON FORMULA PO) Take 1 capsule by mouth daily. With  Vit  C.      . loratadine (CLARITIN) 10 MG tablet Take 10 mg by mouth daily.      . pantoprazole (PROTONIX) 40 MG tablet Take 1 tablet (40 mg total) by mouth 2 (two) times daily.  60 tablet  3  . pravastatin (PRAVACHOL) 20 MG tablet Take 20 mg by mouth at bedtime.        . vitamin B-12 (CYANOCOBALAMIN) 1000 MCG tablet Take 3,000 mcg by mouth daily.        Allergies: Allergies  Allergen Reactions  . Niacin And Related Other (See  Comments)    flushing     Family History: Family History  Problem Relation Age of Onset  . Colon cancer Neg Hx   . Stomach cancer Neg Hx     Social History: History  Substance Use Topics  . Smoking status: Never Smoker   . Smokeless tobacco: Never Used  . Alcohol Use: No    Review of Systems: 10 point review of systems was completed and is negative except as noted above.   Physical Exam: Blood pressure 113/67, pulse 68, temperature 97.6 F (36.4 C), temperature source Oral, resp. rate 20, height 5\' 8"  (1.727 m), weight 213 lb 9.6 oz (96.888 kg).  General appearance: Alert, cooperative, well nourished, well developed, no apparent distress Head: Normocephalic, without obvious abnormality, atraumatic Eyes: Conjunctivae/corneas clear,  PERRLA, EOMI, anti-icteric sclera Nose: Nares, septum and mucosa are normal, no drainage or sinus tenderness Throat: Lips, mucosa, tongue, teeth and gums are normal Neck: no adenopathy, supple, symmetrical, trachea midline and thyroid not enlarged, symmetric, no tenderness/mass/nodules Back: Symmetric, impaired ROM, no CVA tenderness Cardio: Regular rate and rhythm, S1, S2 normal, no murmur, click, rub or gallop GI: Soft, non-tender, distended, hypoactive bowel sounds, no organomegaly Extremities: Extremities normal, atraumatic, no cyanosis or edema Lymph nodes: Cervical, supraclavicular, and axillary nodes normal Neurologic: Grossly normal   Laboratory Data: Results for orders placed in visit on 05/27/12 (from the past 48 hour(s))  CBC WITH DIFFERENTIAL  Status: Abnormal   Collection Time   05/27/12  8:47 AM      Component Value Range Comment   WBC 4.5  3.9 - 10.3 10e3/uL    NEUT# 2.5  1.5 - 6.5 10e3/uL    HGB 11.5 (*) 11.6 - 15.9 g/dL    HCT 16.1  09.6 - 04.5 %    Platelets 246  145 - 400 10e3/uL    MCV 81.5  79.5 - 101.0 fL    MCH 26.6  25.1 - 34.0 pg    MCHC 32.7  31.5 - 36.0 g/dL    RBC 4.09  8.11 - 9.14 10e6/uL    RDW 13.6   11.2 - 14.5 %    lymph# 1.6  0.9 - 3.3 10e3/uL    MONO# 0.4  0.1 - 0.9 10e3/uL    Eosinophils Absolute 0.1  0.0 - 0.5 10e3/uL    Basophils Absolute 0.0  0.0 - 0.1 10e3/uL    NEUT% 54.2  38.4 - 76.8 %    LYMPH% 34.2  14.0 - 49.7 %    MONO% 9.1  0.0 - 14.0 %    EOS% 1.9  0.0 - 7.0 %    BASO% 0.6  0.0 - 2.0 %     Impression/Plan: Nancy [name] is a 51 year old woman who is status post subtotal gastrectomy on 02/05/2010 for T3 N2 poorly differentiated  invasive gastric adenocarcinoma with 2 of 20 positive lymph nodes. She completed adjuvant chemotherapy with 5-FU and leucovorin given daily for 5 days between 03/13/2010 and 03/17/2010 followed by external radiation therapy with oral Xeloda between 04/17/2010 and 05/21/2010. She then resumed chemotherapy with 5-FU and leucovorin in 06/2010 completing 2 additional cycles. Her last colonoscopy with Dr. Stan Head was 06/10/2011 and endoscopy on 12/30/2011.  Her current CTs indicate no evidence of recurrence, however, there is a new left lower lobe nodule that is felt to be inflammatory versus infectious. With this said,  [name] will have a  follow up with a chest CT and Abdominal scan in 6 months' time per Dr. Dalene Carrow.  The patient is also scheduled for an office visit with Dr. Dalene Carrow and laboratories in 6 months.  The patient has been asked to follow up with Dr. Mayford Knife, her PCP regarding palpitations, diaphoresis and back pain.  Ruble Pumphrey NP-C 05/27/2012, 10:34 AM

## 2012-05-27 NOTE — Telephone Encounter (Signed)
Pt has her ct scan appts at [facility].

## 2012-05-27 NOTE — Telephone Encounter (Signed)
gve the pt her feb 2014 appt calendar °

## 2012-05-27 NOTE — Patient Instructions (Signed)
Patient ID: Nancy [name],   DOB: 17-Oct-1960,  MRN: [id number]   [facility] Discharge Instructions  RECOMMENDATIONS MAD BY THE CONSULTANT AND ANY TEST RESULT(S) WILL BE FORWARDED TO YOU REFERRING DOCTOR   EXAM FINDINGS BY NURSE PRACTITIONER TODAY TO REPORT TO THE CLINIC OR PRIMARY PROVIDER: Pneumonia vaccine Influenza vaccine Palpitations (heart) Sweating Back pain   Your Current Medications Are: Current Outpatient Prescriptions  Medication Sig Dispense Refill  . acetaminophen-codeine (TYLENOL #3) 300-30 MG per tablet Take 1 tablet by mouth 2 (two) times daily as needed.      Marland Kitchen estrogen, conjugated,-medroxyprogesterone (PREMPRO) 0.625-2.5 MG per tablet Take 1 tablet by mouth daily.      . Iron-Folic Acid-Vit B12 (IRON FORMULA PO) Take 1 capsule by mouth daily. With  Vit  C.      . loratadine (CLARITIN) 10 MG tablet Take 10 mg by mouth daily.      . pantoprazole (PROTONIX) 40 MG tablet Take 1 tablet (40 mg total) by mouth 2 (two) times daily.  60 tablet  3  . pravastatin (PRAVACHOL) 20 MG tablet Take 20 mg by mouth at bedtime.        . vitamin B-12 (CYANOCOBALAMIN) 1000 MCG tablet Take 3,000 mcg by mouth daily.         INSTRUCTIONS GIVEN, DISCUSSED AND FOLLOW-UP: Avoid fried foods  I acknowledge that I have been informed and understand all the instructions given to me and have received a copy.  I do not have any further questions at this time, but I understand that I may call the [facility] at [phone] during business hours should I have any further questions or need assistance in obtaining follow-up care.   05/27/2012, 10:12 AM

## 2012-08-22 ENCOUNTER — Encounter (HOSPITAL_COMMUNITY): Payer: Self-pay

## 2012-08-22 ENCOUNTER — Emergency Department (HOSPITAL_COMMUNITY)
Admission: EM | Admit: 2012-08-22 | Discharge: 2012-08-22 | Disposition: A | Discharge status: Home / Self Care | Payer: Managed Care, Other (non HMO) | Attending: Emergency Medicine | Admitting: Emergency Medicine

## 2012-08-22 ENCOUNTER — Emergency Department (HOSPITAL_COMMUNITY): Payer: Managed Care, Other (non HMO)

## 2012-08-22 DIAGNOSIS — K5900 Constipation, unspecified: Secondary | ICD-10-CM | POA: Insufficient documentation

## 2012-08-22 DIAGNOSIS — I10 Essential (primary) hypertension: Secondary | ICD-10-CM | POA: Insufficient documentation

## 2012-08-22 DIAGNOSIS — Z8742 Personal history of other diseases of the female genital tract: Secondary | ICD-10-CM | POA: Insufficient documentation

## 2012-08-22 DIAGNOSIS — C169 Malignant neoplasm of stomach, unspecified: Secondary | ICD-10-CM

## 2012-08-22 DIAGNOSIS — R1013 Epigastric pain: Secondary | ICD-10-CM

## 2012-08-22 DIAGNOSIS — Z79899 Other long term (current) drug therapy: Secondary | ICD-10-CM | POA: Insufficient documentation

## 2012-08-22 LAB — CBC WITH DIFFERENTIAL/PLATELET
Basophils Absolute: 0 10*3/uL (ref 0.0–0.1)
Basophils Relative: 0 % (ref 0–1)
Lymphocytes Relative: 24 % (ref 12–46)
MCHC: 32.3 g/dL (ref 30.0–36.0)
Neutro Abs: 5.4 10*3/uL (ref 1.7–7.7)
Neutrophils Relative %: 69 % (ref 43–77)
Platelets: 251 10*3/uL (ref 150–400)
RDW: 12.6 % (ref 11.5–15.5)
WBC: 7.9 10*3/uL (ref 4.0–10.5)

## 2012-08-22 LAB — COMPREHENSIVE METABOLIC PANEL
ALT: 13 U/L (ref 0–35)
AST: 19 U/L (ref 0–37)
Albumin: 3.7 g/dL (ref 3.5–5.2)
Alkaline Phosphatase: 117 U/L (ref 39–117)
CO2: 26 mEq/L (ref 19–32)
Chloride: 99 mEq/L (ref 96–112)
Creatinine, Ser: 0.83 mg/dL (ref 0.50–1.10)
Potassium: 3.7 mEq/L (ref 3.5–5.1)
Sodium: 135 mEq/L (ref 135–145)
Total Bilirubin: 0.3 mg/dL (ref 0.3–1.2)

## 2012-08-22 LAB — TROPONIN I: Troponin I: <0.3 ng/mL (ref <0.30)

## 2012-08-22 MED ORDER — PANTOPRAZOLE SODIUM 40 MG IV SOLR
40.0000 mg | Freq: Once | INTRAVENOUS | Status: AC
  Administered 2012-08-22: 40 mg via INTRAVENOUS
  Filled 2012-08-22: qty 40

## 2012-08-22 MED ORDER — PANTOPRAZOLE SODIUM 40 MG PO TBEC: 40.0000 mg | DELAYED_RELEASE_TABLET | Freq: Two times a day (BID) | ORAL | Status: DC

## 2012-08-22 MED ORDER — DOCUSATE SODIUM 100 MG PO CAPS: 100.0000 mg | ORAL_CAPSULE | Freq: Two times a day (BID) | ORAL | Status: DC

## 2012-08-22 MED ORDER — MORPHINE SULFATE 4 MG/ML IJ SOLN
4.0000 mg | Freq: Once | INTRAMUSCULAR | Status: AC
  Administered 2012-08-22: 4 mg via INTRAVENOUS
  Filled 2012-08-22: qty 1

## 2012-08-22 MED ORDER — GI COCKTAIL ~~LOC~~
30.0000 mL | Freq: Once | ORAL | Status: AC
  Administered 2012-08-22: 30 mL via ORAL
  Filled 2012-08-22: qty 30

## 2012-08-22 MED ORDER — POLYETHYLENE GLYCOL 3350 17 G PO PACK: 17.0000 g | PACK | Freq: Every day | ORAL | Status: DC

## 2012-08-22 NOTE — ED Notes (Signed)
To radiology

## 2012-08-22 NOTE — ED Notes (Signed)
EDP into room 

## 2012-08-22 NOTE — ED Notes (Signed)
Placed pt on heart monitor

## 2012-08-22 NOTE — ED Provider Notes (Signed)
History     CSN: [id number]  Arrival date & time 08/22/12  0140   First MD Initiated Contact with Patient 08/22/12 0202      Chief Complaint  Patient presents with  . Chest Pain    (Consider location/radiation/quality/duration/timing/severity/associated sxs/prior treatment) HPI 51 year old female presents to emergency department with complaint of upper abdominal pain extending into her chest. Patient reports she had similar symptoms about a week ago that lasted for about an hour and then resolved. Patient reports pain this evening around 8 PM and again lasted an hour and resolve. Pain then recurred around 2 AM. Patient with past medical history significant for gastric cancer status post resection and chemotherapy. Patient has been cancer free since 2011, is awaiting a repeat CT scan in February. She denies previous history of reflux or gastritis. Pain is sharp just under her sternum. She denies any shortness of breath, nausea, diaphoresis. Patient has history of hypertension, denies family history of coronary disease. She denies any leg swelling. No recent chemotherapy or radiation. Past Medical History  Diagnosis Date  . 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 Hypertension   . Dermoid cyst of ovary   . Fibroids   . Malignant neoplasm of stomach, unspecified site 11/11/2011    Past Surgical History  Procedure Date  . Bilroth ii procedure 02/05/10    adenocarcinoma of antrum, Dr. Lindie Spruce  . Portacath placement 03/01/2010    Dr. Lindie Spruce  . Esophagogastroduodenoscopy 2011    Adenocarcinoma, stomach. Dr. Evette Cristal  . Colonoscopy 06/10/2011    Normal  . Port-a-cath removal 11/04/2011    Procedure: MINOR REMOVAL PORT-A-CATH;  Surgeon: Jetty Duhamel, MD;  Location: [facility];  Service: General;  Laterality: Left;    Family History  Problem Relation Age of Onset  . Colon cancer Neg Hx   . Stomach cancer Neg Hx     History    Substance Use Topics  . Smoking status: Never Smoker   . Smokeless tobacco: Never Used  . Alcohol Use: No    OB History    Grav Para Term Preterm Abortions TAB SAB Ect Mult Living                  Review of Systems  All other systems reviewed and are negative.    Allergies  Niacin and related  Home Medications   Current Outpatient Rx  Name  Route  Sig  Dispense  Refill  . IRON FORMULA PO   Oral   Take 1 capsule by mouth daily. With  Vit  C.         . PRAVASTATIN SODIUM 20 MG PO TABS   Oral   Take 20 mg by mouth at bedtime.           Marland Kitchen VITAMIN B-12 1000 MCG PO TABS   Oral   Take 3,000 mcg by mouth daily.         Marland Kitchen DOCUSATE SODIUM 100 MG PO CAPS   Oral   Take 1 capsule (100 mg total) by mouth every 12 (twelve) hours.   60 capsule   0   . PANTOPRAZOLE SODIUM 40 MG PO TBEC   Oral   Take 1 tablet (40 mg total) by mouth 2 (two) times daily.   60 tablet   0     Patient will need to follow up with PCP for future ...   . POLYETHYLENE GLYCOL 3350 PO  PACK   Oral   Take 17 g by mouth daily.   14 each   0     BP 115/67  Pulse 68  Temp 97.9 F (36.6 C) (Oral)  Resp 16  SpO2 100%  Physical Exam  Nursing note and vitals reviewed. Constitutional: She is oriented to person, place, and time. She appears well-developed and well-nourished.  HENT:  Head: Normocephalic and atraumatic.  Nose: Nose normal.  Mouth/Throat: Oropharynx is clear and moist.  Eyes: Conjunctivae normal and EOM are normal. Pupils are equal, round, and reactive to light.  Neck: Normal range of motion. Neck supple. No JVD present. No tracheal deviation present. No thyromegaly present.  Cardiovascular: Normal rate, regular rhythm, normal heart sounds and intact distal pulses.  Exam reveals no gallop and no friction rub.   No murmur heard. Pulmonary/Chest: Effort normal and breath sounds normal. No stridor. No respiratory distress. She has no wheezes. She has no rales. She exhibits no  tenderness.  Abdominal: Soft. Bowel sounds are normal. She exhibits no distension and no mass. There is no tenderness. There is no rebound and no guarding.  Musculoskeletal: Normal range of motion. She exhibits no edema and no tenderness.  Lymphadenopathy:    She has no cervical adenopathy.  Neurological: She is alert and oriented to person, place, and time. She exhibits normal muscle tone. Coordination normal.  Skin: Skin is warm and dry. No rash noted. No erythema. No pallor.  Psychiatric: She has a normal mood and affect. Her behavior is normal. Judgment and thought content normal.    ED Course  Procedures (including critical care time)  Labs Reviewed  CBC WITH DIFFERENTIAL - Abnormal; Notable for the following:    Hemoglobin 11.3 (*)     HCT 35.0 (*)     MCH 25.6 (*)     All other components within normal limits  COMPREHENSIVE METABOLIC PANEL - Abnormal; Notable for the following:    Glucose, Bld 113 (*)     GFR calc non Af Amer 80 (*)     All other components within normal limits  LIPASE, BLOOD  TROPONIN I  D-DIMER, QUANTITATIVE   Dg Chest 2 View  08/22/2012  *RADIOLOGY REPORT*  Clinical Data: Chest pain and epigastric pain.  CHEST - 2 VIEW  Comparison: Chest radiograph performed 09/13/2011, and CT of the chest performed 11/11/2011  Findings: The lungs are well-aerated and clear.  There is no evidence of focal opacification, pleural effusion or pneumothorax.  The heart is borderline enlarged.  No acute osseous abnormalities are seen.  IMPRESSION: No acute cardiopulmonary process seen; borderline cardiomegaly noted.   Original Report Authenticated By: Tonia Ghent, M.D.    Dg Abd 2 Views  08/22/2012  *RADIOLOGY REPORT*  Clinical Data: Abdominal pain.  ABDOMEN - 2 VIEW  Comparison: Abdominal radiograph performed 07/04/2010, and CT of the chest, abdomen and pelvis performed 11/11/2011  Findings: The visualized bowel gas pattern is nonspecific.  The colon is largely filled with  stool; there is some degree of distension of small bowel loops, though nondistended small bowel loops are also seen, suggesting against obstruction.  No free intra- abdominal air is identified, though evaluation for free air is limited on a single supine view.  The visualized osseous structures are within normal limits; the sacroiliac joints are unremarkable in appearance.  The visualized lung bases are essentially clear.  IMPRESSION: Nonspecific bowel gas pattern.  Colon largely filled with stool; some degree of distension of small bowel loops, though nondistended  small bowel loops are also seen, suggesting against obstruction. No free intra-abdominal air seen.   Original Report Authenticated By: Tonia Ghent, M.D.     Date: 08/22/2012  Rate: 91  Rhythm: normal sinus rhythm  QRS Axis: right  Intervals: normal  ST/T Wave abnormalities: normal  Conduction Disutrbances:none  Narrative Interpretation:   Old EKG Reviewed: none available     1. Constipation   2. Epigastric pain   3. Stomach cancer       MDM  51 year old female with epigastric pain radiation of the chest. She has no coronary artery disease risk factors aside from hypertension. No signs of obstruction on x-ray. No pain currently, no pain with palpation of the abdomen. D-dimer negative have low suspicion for PE. Symptoms seem consistent with possible gastritis or GERD. Will continue her Protonix. X-ray shows some stool burden, will start on MiraLAX. We'll have her followup with her primary care Dr.        Olivia Mackie, MD 08/22/12 [phone]

## 2012-08-22 NOTE — ED Notes (Signed)
Sleeping/ resting, arousable, NAD, calm, passively interactive, (denies: nv, sob), mentions only abd discomfort, pending xray results.

## 2012-08-22 NOTE — ED Notes (Signed)
Pt complains of chest pain x 1 week has seem pmd for same and sts that she is scared because she has had surgery in 2007

## 2012-08-22 NOTE — ED Notes (Signed)
When asked where pain starts she points to abd and is holidng abd.

## 2012-09-18 ENCOUNTER — Encounter (HOSPITAL_COMMUNITY): Payer: Self-pay | Admitting: *Deleted

## 2012-09-18 DIAGNOSIS — S139XXA Sprain of joints and ligaments of unspecified parts of neck, initial encounter: Secondary | ICD-10-CM | POA: Insufficient documentation

## 2012-09-18 DIAGNOSIS — I10 Essential (primary) hypertension: Secondary | ICD-10-CM | POA: Insufficient documentation

## 2012-09-18 DIAGNOSIS — X58XXXA Exposure to other specified factors, initial encounter: Secondary | ICD-10-CM | POA: Insufficient documentation

## 2012-09-18 DIAGNOSIS — Z8742 Personal history of other diseases of the female genital tract: Secondary | ICD-10-CM | POA: Insufficient documentation

## 2012-09-18 DIAGNOSIS — Y939 Activity, unspecified: Secondary | ICD-10-CM | POA: Insufficient documentation

## 2012-09-18 DIAGNOSIS — Y929 Unspecified place or not applicable: Secondary | ICD-10-CM | POA: Insufficient documentation

## 2012-09-18 DIAGNOSIS — Z79899 Other long term (current) drug therapy: Secondary | ICD-10-CM | POA: Insufficient documentation

## 2012-09-18 DIAGNOSIS — Z85028 Personal history of other malignant neoplasm of stomach: Secondary | ICD-10-CM | POA: Insufficient documentation

## 2012-09-18 NOTE — ED Notes (Signed)
Pt states since yesterday she has had a still neck, unable to turn to the right or left.  Denies injury.  Also c/o pain when moving right wrist.

## 2012-09-19 ENCOUNTER — Emergency Department (HOSPITAL_COMMUNITY)
Admission: EM | Admit: 2012-09-19 | Discharge: 2012-09-19 | Disposition: A | Discharge status: Home / Self Care | Payer: Managed Care, Other (non HMO) | Attending: Emergency Medicine | Admitting: Emergency Medicine

## 2012-09-19 ENCOUNTER — Emergency Department (HOSPITAL_COMMUNITY): Payer: Managed Care, Other (non HMO)

## 2012-09-19 DIAGNOSIS — S161XXA Strain of muscle, fascia and tendon at neck level, initial encounter: Secondary | ICD-10-CM

## 2012-09-19 LAB — CBC WITH DIFFERENTIAL/PLATELET
Basophils Absolute: 0 10*3/uL (ref 0.0–0.1)
Basophils Relative: 0 % (ref 0–1)
Eosinophils Relative: 1 % (ref 0–5)
Hemoglobin: 11.5 g/dL — ABNORMAL LOW (ref 12.0–15.0)
Lymphocytes Relative: 22 % (ref 12–46)
MCHC: 32.5 g/dL (ref 30.0–36.0)
MCV: 81.2 fL (ref 78.0–100.0)
Platelets: 209 10*3/uL (ref 150–400)
RBC: 4.36 MIL/uL (ref 3.87–5.11)
RDW: 14.1 % (ref 11.5–15.5)

## 2012-09-19 LAB — COMPREHENSIVE METABOLIC PANEL
ALT: 25 U/L (ref 0–35)
AST: 22 U/L (ref 0–37)
Albumin: 3.7 g/dL (ref 3.5–5.2)
CO2: 24 mEq/L (ref 19–32)
Calcium: 9.8 mg/dL (ref 8.4–10.5)
Creatinine, Ser: 0.91 mg/dL (ref 0.50–1.10)
GFR calc non Af Amer: 71 mL/min — ABNORMAL LOW (ref >90)
Sodium: 136 mEq/L (ref 135–145)
Total Protein: 7.4 g/dL (ref 6.0–8.3)

## 2012-09-19 MED ORDER — CYCLOBENZAPRINE HCL 10 MG PO TABS: 10.0000 mg | ORAL_TABLET | Freq: Two times a day (BID) | ORAL | Status: DC | PRN

## 2012-09-19 MED ORDER — ONDANSETRON HCL 4 MG/2ML IJ SOLN
4.0000 mg | Freq: Once | INTRAMUSCULAR | Status: AC
  Administered 2012-09-19: 4 mg via INTRAVENOUS
  Filled 2012-09-19: qty 2

## 2012-09-19 MED ORDER — LORAZEPAM 2 MG/ML IJ SOLN
1.0000 mg | Freq: Once | INTRAMUSCULAR | Status: AC
  Administered 2012-09-19: 1 mg via INTRAVENOUS
  Filled 2012-09-19: qty 1

## 2012-09-19 MED ORDER — SODIUM CHLORIDE 0.9 % IV BOLUS (SEPSIS)
500.0000 mL | Freq: Once | INTRAVENOUS | Status: AC
  Administered 2012-09-19: 500 mL via INTRAVENOUS

## 2012-09-19 MED ORDER — OXYCODONE-ACETAMINOPHEN 5-325 MG PO TABS: 1.0000 | ORAL_TABLET | Freq: Four times a day (QID) | ORAL | Status: DC | PRN

## 2012-09-19 MED ORDER — KETOROLAC TROMETHAMINE 30 MG/ML IJ SOLN
30.0000 mg | Freq: Once | INTRAMUSCULAR | Status: AC
  Administered 2012-09-19: 30 mg via INTRAVENOUS
  Filled 2012-09-19: qty 1

## 2012-09-19 NOTE — ED Notes (Signed)
Pt returned from xray

## 2012-09-19 NOTE — ED Notes (Signed)
Pt to xray

## 2012-09-19 NOTE — ED Provider Notes (Signed)
History     CSN: [id number]  Arrival date & time 09/18/12  2222   First MD Initiated Contact with Patient 09/19/12 0309      Chief Complaint  Patient presents with  . Torticollis  . Wrist Pain    (Consider location/radiation/quality/duration/timing/severity/associated sxs/prior treatment) HPI... sharp pain in right lateral neck for 24 hours. Patient thinks is related to movement at work.  No neurological deficits, meningeal signs, fever, chills, headache. Certain positioning makes pain worse. Severity is mild to moderate   Past Medical History  Diagnosis Date  . 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 Hypertension   . Dermoid cyst of ovary   . Fibroids   . Malignant neoplasm of stomach, unspecified site 11/11/2011    Past Surgical History  Procedure Date  . Bilroth ii procedure 02/05/10    adenocarcinoma of antrum, Dr. Lindie Spruce  . Portacath placement 03/01/2010    Dr. Lindie Spruce  . Esophagogastroduodenoscopy 2011    Adenocarcinoma, stomach. Dr. Evette Cristal  . Colonoscopy 06/10/2011    Normal  . Port-a-cath removal 11/04/2011    Procedure: MINOR REMOVAL PORT-A-CATH;  Surgeon: Jetty Duhamel, MD;  Location: [facility];  Service: General;  Laterality: Left;    Family History  Problem Relation Age of Onset  . Colon cancer Neg Hx   . Stomach cancer Neg Hx     History  Substance Use Topics  . Smoking status: Never Smoker   . Smokeless tobacco: Never Used  . Alcohol Use: Yes     Comment: occasionally    OB History    Grav Para Term Preterm Abortions TAB SAB Ect Mult Living                  Review of Systems  All other systems reviewed and are negative.    Allergies  Niacin and related  Home Medications   Current Outpatient Rx  Name  Route  Sig  Dispense  Refill  . DOCUSATE SODIUM 100 MG PO CAPS   Oral   Take 1 capsule (100 mg total) by mouth every 12 (twelve) hours.   60 capsule   0   . IRON FORMULA  PO   Oral   Take 1 capsule by mouth daily. With  Vit  C.         . PANTOPRAZOLE SODIUM 40 MG PO TBEC   Oral   Take 1 tablet (40 mg total) by mouth 2 (two) times daily.   60 tablet   0     Patient will need to follow up with PCP for future ...   . POLYETHYLENE GLYCOL 3350 PO PACK   Oral   Take 17 g by mouth daily.   14 each   0   . PRAVASTATIN SODIUM 20 MG PO TABS   Oral   Take 20 mg by mouth at bedtime.           Marland Kitchen VITAMIN B-12 1000 MCG PO TABS   Oral   Take 3,000 mcg by mouth daily.         . CYCLOBENZAPRINE HCL 10 MG PO TABS   Oral   Take 1 tablet (10 mg total) by mouth 2 (two) times daily as needed for muscle spasms.   20 tablet   0   . OXYCODONE-ACETAMINOPHEN 5-325 MG PO TABS   Oral   Take 1-2 tablets by mouth every 6 (six) hours as needed for pain.  20 tablet   0     BP 126/62  Pulse 63  Temp 100.1 F (37.8 C) (Oral)  Resp 16  SpO2 99%  Physical Exam  Nursing note and vitals reviewed. Constitutional: She is oriented to person, place, and time. She appears well-developed and well-nourished.  HENT:  Head: Normocephalic and atraumatic.  Eyes: Conjunctivae normal and EOM are normal. Pupils are equal, round, and reactive to light.  Neck: Neck supple.       Slight tenderness right lateral neck.  No meningeal signs  Cardiovascular: Normal rate, regular rhythm and normal heart sounds.   Pulmonary/Chest: Effort normal and breath sounds normal.  Abdominal: Soft. Bowel sounds are normal.  Musculoskeletal: Normal range of motion.  Neurological: She is alert and oriented to person, place, and time.  Skin: Skin is warm and dry.  Psychiatric: She has a normal mood and affect.    ED Course  Procedures (including critical care time)  Labs Reviewed  CBC WITH DIFFERENTIAL - Abnormal; Notable for the following:    Hemoglobin 11.5 (*)     HCT 35.4 (*)     All other components within normal limits  COMPREHENSIVE METABOLIC PANEL - Abnormal; Notable for the  following:    Glucose, Bld 104 (*)     GFR calc non Af Amer 71 (*)     GFR calc Af Amer 83 (*)     All other components within normal limits   Dg Cervical Spine Complete  09/19/2012  *RADIOLOGY REPORT*  Clinical Data: Right neck pain.  CERVICAL SPINE - COMPLETE 4+ VIEW  Comparison: None.  Findings: Normal alignment.  Disc spaces are maintained.  Early degenerative spurring anteriorly.  No fracture visualized.  Slight diffuse prominence of prevertebral soft tissues.  IMPRESSION: Slightly prominent prevertebral soft tissues without visible underlying bony abnormality.  Early degenerative spurring.   Original Report Authenticated By: Charlett Nose, M.D.      1. Cervical strain       MDM  Plain films of cervical spine showed no acute injury.  No radicular symptoms.  Suspect muscular strain. Discharge home on Percocet #20 and Flexeril 10 mg #20        Donnetta Hutching, MD 09/19/12 [phone]

## 2012-10-03 ENCOUNTER — Telehealth: Payer: Self-pay | Admitting: Oncology

## 2012-10-03 NOTE — Telephone Encounter (Signed)
S/W pt in re change in provider. Pt will see Lonna Cobb on 2/12 @ 9:15.  Calendar mailed.  Dr. Richmond Campbell

## 2012-10-17 ENCOUNTER — Telehealth: Payer: Self-pay | Admitting: Oncology

## 2012-10-17 ENCOUNTER — Encounter: Payer: Self-pay | Admitting: Oncology

## 2012-10-17 NOTE — Telephone Encounter (Signed)
Per chart pt has been talked to...printed letter and appt schedule to be mailed

## 2012-11-23 ENCOUNTER — Ambulatory Visit
Admission: RE | Admit: 2012-11-23 | Discharge: 2012-11-23 | Disposition: A | Discharge status: Home / Self Care | Payer: Managed Care, Other (non HMO) | Source: Ambulatory Visit | Attending: Family | Admitting: Family

## 2012-11-23 ENCOUNTER — Other Ambulatory Visit (HOSPITAL_BASED_OUTPATIENT_CLINIC_OR_DEPARTMENT_OTHER): Payer: Managed Care, Other (non HMO) | Admitting: Lab

## 2012-11-23 DIAGNOSIS — C169 Malignant neoplasm of stomach, unspecified: Secondary | ICD-10-CM

## 2012-11-23 LAB — CBC WITH DIFFERENTIAL/PLATELET
Basophils Absolute: 0 10*3/uL (ref 0.0–0.1)
EOS%: 1.8 % (ref 0.0–7.0)
Eosinophils Absolute: 0.1 10*3/uL (ref 0.0–0.5)
HCT: 38.3 % (ref 34.8–46.6)
HGB: 12.6 g/dL (ref 11.6–15.9)
LYMPH%: 37.9 % (ref 14.0–49.7)
MCH: 26.7 pg (ref 25.1–34.0)
MCV: 81 fL (ref 79.5–101.0)
MONO%: 8.8 % (ref 0.0–14.0)
NEUT#: 3 10*3/uL (ref 1.5–6.5)
NEUT%: 51 % (ref 38.4–76.8)
Platelets: 252 10*3/uL (ref 145–400)

## 2012-11-23 LAB — COMPREHENSIVE METABOLIC PANEL (CC13)
AST: 15 U/L (ref 5–34)
Albumin: 3.8 g/dL (ref 3.5–5.0)
BUN: 18.6 mg/dL (ref 7.0–26.0)
CO2: 28 mEq/L (ref 22–29)
Calcium: 9.8 mg/dL (ref 8.4–10.4)
Chloride: 103 mEq/L (ref 98–107)
Creatinine: 1 mg/dL (ref 0.6–1.1)
Potassium: 4.3 mEq/L (ref 3.5–5.1)

## 2012-11-23 LAB — VITAMIN B12: Vitamin B-12: >2000 pg/mL — ABNORMAL HIGH (ref 211–911)

## 2012-11-23 MED ORDER — IOHEXOL 300 MG/ML  SOLN
125.0000 mL | Freq: Once | INTRAMUSCULAR | Status: AC | PRN
  Administered 2012-11-23: 125 mL via INTRAVENOUS

## 2012-11-25 ENCOUNTER — Telehealth: Payer: Self-pay | Admitting: Oncology

## 2012-11-25 ENCOUNTER — Ambulatory Visit (HOSPITAL_BASED_OUTPATIENT_CLINIC_OR_DEPARTMENT_OTHER): Payer: Managed Care, Other (non HMO) | Admitting: Nurse Practitioner

## 2012-11-25 VITALS — BP 124/77 | HR 72 | Temp 98.7°F | Resp 20 | Ht 68.0 in | Wt 214.9 lb

## 2012-11-25 DIAGNOSIS — C169 Malignant neoplasm of stomach, unspecified: Secondary | ICD-10-CM

## 2012-11-25 DIAGNOSIS — C779 Secondary and unspecified malignant neoplasm of lymph node, unspecified: Secondary | ICD-10-CM

## 2012-11-25 NOTE — Progress Notes (Signed)
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 She underwent a subtotal gastrectomy on 02/03/2010. She completed adjuvant therapy with infusional 5-fluorouracil followed by Xeloda and radiation and additional infusional 5-fluorouracil.  Most recent restaging CT scans of the chest/abdomen/pelvis on 11/23/2012 showed probable resolution, versus lack of visualization, of a 4 mm left upper lobe lung nodule noted on prior CT 11/11/2011. There were no new nodules or areas of airspace disease. There was no mediastinal or hilar adenopathy. The liver, spleen and adrenal glands appeared normal. There was no retroperitoneal or retrocrural adenopathy. A gastrohepatic lymph node measures 9 mm and was felt to be similar to the prior study and was not pathologic by size criteria.  CEA was normal at 0.6 on 11/23/2012.  Most recent EGD on 12/30/2011 showed no evidence of recurrent gastric cancer and was a normal postoperative exam.  Past medical history is significant for hypertension and hypercholesterolemia.  Medications were reviewed and updated. She is on a blood pressure medication but was unable to recall the name.  She is allergic to niacin.  Her mother died at age 12. She is unsure of the cause of death. Her father died at age 25 of "old age". She has 4 siblings all reported to be in good health. No family history of malignancy.  She is originally from [place]. She has been in the [place] 19 years. She is married. She has 3 healthy children. She works in [employer]. No tobacco or alcohol use.  She reports a good appetite. Her weight is stable. No pain. No dysphagia or odynophagia. No nausea or vomiting. She is periodically constipated. No shortness of breath. No chest pain. She notes allergy symptoms with coughing and nasal  congestion when outside.    Objective: Blood pressure 124/77, pulse 72, temperature 98.7 F (37.1 C), temperature source Oral, resp. rate 20, height 5\' 8"  (1.727 m), weight 214 lb 14.4 oz (97.478 kg).  Oropharynx is without thrush or ulceration. No palpable cervical, supraclavicular, axillary or inguinal lymph nodes. Lungs are clear. No wheezes or rales. Regular cardiac rhythm. Abdomen is soft and nontender. No hepatomegaly. Extremities are without edema. Calves are soft and nontender.  Lab Results: Lab Results  Component Value Date   WBC 5.8 11/23/2012   HGB 12.6 11/23/2012   HCT 38.3 11/23/2012   MCV 81.0 11/23/2012   PLT 252 11/23/2012    Chemistry:    Chemistry      Component Value Date/Time   NA 139 11/23/2012 1009   NA 136 09/19/2012 0319   NA 140 08/06/2011 0901   K 4.3 11/23/2012 1009   K 4.2 09/19/2012 0319   K 4.3 08/06/2011 0901   CL 103 11/23/2012 1009   CL 101 09/19/2012 0319   CL 100 08/06/2011 0901   CO2 28 11/23/2012 1009   CO2 24 09/19/2012 0319   CO2 30 08/06/2011 0901   BUN 18.6 11/23/2012 1009   BUN 14 09/19/2012 0319   BUN 22 08/06/2011 0901   CREATININE 1.0 11/23/2012 1009   CREATININE 0.91 09/19/2012 0319   CREATININE 0.9 08/06/2011 0901      Component Value Date/Time   CALCIUM 9.8 11/23/2012 1009   CALCIUM 9.8 09/19/2012 0319   CALCIUM 9.2 08/06/2011 0901   ALKPHOS 105 11/23/2012 1009   ALKPHOS 99 09/19/2012 0319   ALKPHOS  66 08/06/2011 0901   AST 15 11/23/2012 1009   AST 22 09/19/2012 0319   AST 21 08/06/2011 0901   ALT 14 11/23/2012 1009   ALT 25 09/19/2012 0319   BILITOT 0.48 11/23/2012 1009   BILITOT 0.3 09/19/2012 0319   BILITOT 0.60 08/06/2011 0901       Studies/Results: Ct Chest W Contrast  11/23/2012  *RADIOLOGY REPORT*  Clinical Data:  History of adenocarcinoma of the stomach, diagnosed in 2010.  Status post surgery.  CT CHEST, ABDOMEN AND PELVIS WITH CONTRAST  Technique: Contiguous axial images of the chest abdomen and pelvis were obtained after  IV contrast administration.  Contrast: 125 ml Omnipaque-300  Comparison: 11/11/2011  CT CHEST  Findings: Lung windows demonstrate probable resolution (versus lack of visualization) of the 4 mm left upper lobe lung nodule.  No new nodules or areas of airspace disease.  Soft tissue windows demonstrate no supraclavicular adenopathy. Heart mildly enlarged and accentuated by a mild pectus excavatum deformity. No pericardial or pleural effusion.  No mediastinal or hilar adenopathy.  Suspect minimal residual thymic tissue in the anterior mediastinum, including on image 18/series 3.  Similar.  IMPRESSION:  1. No acute process or evidence of metastatic disease in the chest. 2.  The left upper lobe lung nodule is no longer identified and has likely resolved. 3.  Cardiomegaly.  CT ABDOMEN AND PELVIS  Findings:  Normal liver, spleen, adrenal glands.  Status post partial gastrectomy with gastrojejunostomy.  Normal pancreas. Minimal motion degradation in the upper abdomen.  Normal gallbladder, biliary tract, adrenal glands, kidneys. No retroperitoneal or retrocrural adenopathy.  A gastrohepatic ligament node measures 9 mm image 54/series 3 and is felt to be similar to on image 62/series 3 of the prior.  Not pathologic by size criteria.  Colonic stool burden suggests constipation.  Minimal nonobstructive transverse colon containing ventral abdominal wall laxity on 69/series 3.  Unchanged. Normal terminal ileum.  Appendix is not visualized but there is no evidence of right lower quadrant inflammation.  Normal small bowel without abdominal ascites.    No evidence of omental or peritoneal disease.  No pelvic adenopathy.  Normal urinary bladder and uterus.  A right ovarian teratoma measures 5.6 by 5.0 cm on image 104/series 3 and is similar. No significant free fluid.  More inferior area of fat containing ventral abdominal wall laxity or hernia.  Degenerative disc disease at the lumbosacral junction.  IMPRESSION:  1.  Gastrohepatic  ligament node is not pathologic by size criteria and felt to be similar in size to on the prior exam.  Given its location and mild prominence, attention on follow-up is warranted. 2.  Otherwise, no evidence of metastatic disease within the abdomen. 3.  Mild motion degradation. 4.  Possible constipation. 5.  Similar right ovarian dermoid. 6.  Ventral abdominal wall laxity containing minimal transverse colon.   Original Report Authenticated By: Jeronimo Greaves, M.D.    Ct Abdomen Pelvis W Contrast  11/23/2012  *RADIOLOGY REPORT*  Clinical Data:  History of adenocarcinoma of the stomach, diagnosed in 2010.  Status post surgery.  CT CHEST, ABDOMEN AND PELVIS WITH CONTRAST  Technique: Contiguous axial images of the chest abdomen and pelvis were obtained after IV contrast administration.  Contrast: 125 ml Omnipaque-300  Comparison: 11/11/2011  CT CHEST  Findings: Lung windows demonstrate probable resolution (versus lack of visualization) of the 4 mm left upper lobe lung nodule.  No new nodules or areas of airspace disease.  Soft tissue windows demonstrate no supraclavicular adenopathy.  Heart mildly enlarged and accentuated by a mild pectus excavatum deformity. No pericardial or pleural effusion.  No mediastinal or hilar adenopathy.  Suspect minimal residual thymic tissue in the anterior mediastinum, including on image 18/series 3.  Similar.  IMPRESSION:  1. No acute process or evidence of metastatic disease in the chest. 2.  The left upper lobe lung nodule is no longer identified and has likely resolved. 3.  Cardiomegaly.  CT ABDOMEN AND PELVIS  Findings:  Normal liver, spleen, adrenal glands.  Status post partial gastrectomy with gastrojejunostomy.  Normal pancreas. Minimal motion degradation in the upper abdomen.  Normal gallbladder, biliary tract, adrenal glands, kidneys. No retroperitoneal or retrocrural adenopathy.  A gastrohepatic ligament node measures 9 mm image 54/series 3 and is felt to be similar to on image  62/series 3 of the prior.  Not pathologic by size criteria.  Colonic stool burden suggests constipation.  Minimal nonobstructive transverse colon containing ventral abdominal wall laxity on 69/series 3.  Unchanged. Normal terminal ileum.  Appendix is not visualized but there is no evidence of right lower quadrant inflammation.  Normal small bowel without abdominal ascites.    No evidence of omental or peritoneal disease.  No pelvic adenopathy.  Normal urinary bladder and uterus.  A right ovarian teratoma measures 5.6 by 5.0 cm on image 104/series 3 and is similar. No significant free fluid.  More inferior area of fat containing ventral abdominal wall laxity or hernia.  Degenerative disc disease at the lumbosacral junction.  IMPRESSION:  1.  Gastrohepatic ligament node is not pathologic by size criteria and felt to be similar in size to on the prior exam.  Given its location and mild prominence, attention on follow-up is warranted. 2.  Otherwise, no evidence of metastatic disease within the abdomen. 3.  Mild motion degradation. 4.  Possible constipation. 5.  Similar right ovarian dermoid. 6.  Ventral abdominal wall laxity containing minimal transverse colon.   Original Report Authenticated By: Jeronimo Greaves, M.D.     Medications: I have reviewed the patient's current medications.  Assessment/Plan:  1. Stage III (T3 N2) gastric cancer; status post subtotal gastrectomy 02/05/2010 with pathology showing a poorly differentiated, invasive adenocarcinoma, spanning 2.5 cm, with extension into and focally through the muscularis propria; metastatic carcinoma identified in 3 of 20 lymph nodes. She completed adjuvant therapy with infusional 5-fluorouracil followed by Xeloda and radiation and additional infusional 5-fluorouracil. Restaging CT scans chest/abdomen/pelvis 11/23/2012 with no evidence of metastatic disease. CEA normal on 11/23/2012. 2. Hypertension. 3. Hypercholesterolemia.  Disposition-Ms. Currington remains  in remission from the gastric cancer. She will return for a followup visit in 9 months. She will contact the office in the interim with any problems.  Patient seen with Dr. Truett Perna.  Lonna Cobb ANP/GNP-BC

## 2012-11-25 NOTE — Telephone Encounter (Signed)
gv and printed appt schedule for pt for Nov °

## 2013-01-08 ENCOUNTER — Other Ambulatory Visit (HOSPITAL_COMMUNITY): Payer: Self-pay | Admitting: Specialist

## 2013-01-08 DIAGNOSIS — Z1231 Encounter for screening mammogram for malignant neoplasm of breast: Secondary | ICD-10-CM

## 2013-03-29 ENCOUNTER — Ambulatory Visit (HOSPITAL_COMMUNITY)
Admission: RE | Admit: 2013-03-29 | Discharge: 2013-03-29 | Disposition: A | Discharge status: Home / Self Care | Payer: Managed Care, Other (non HMO) | Source: Ambulatory Visit | Attending: Specialist | Admitting: Specialist

## 2013-03-29 ENCOUNTER — Ambulatory Visit (HOSPITAL_COMMUNITY): Payer: Managed Care, Other (non HMO)

## 2013-03-29 DIAGNOSIS — Z1231 Encounter for screening mammogram for malignant neoplasm of breast: Secondary | ICD-10-CM | POA: Insufficient documentation

## 2013-07-08 ENCOUNTER — Emergency Department (HOSPITAL_COMMUNITY)
Admission: EM | Admit: 2013-07-08 | Discharge: 2013-07-09 | Disposition: A | Discharge status: Home / Self Care | Payer: Managed Care, Other (non HMO) | Attending: Emergency Medicine | Admitting: Emergency Medicine

## 2013-07-08 ENCOUNTER — Encounter (HOSPITAL_COMMUNITY): Payer: Self-pay | Admitting: Emergency Medicine

## 2013-07-08 DIAGNOSIS — M25569 Pain in unspecified knee: Secondary | ICD-10-CM | POA: Insufficient documentation

## 2013-07-08 DIAGNOSIS — Z79899 Other long term (current) drug therapy: Secondary | ICD-10-CM | POA: Insufficient documentation

## 2013-07-08 DIAGNOSIS — IMO0001 Reserved for inherently not codable concepts without codable children: Secondary | ICD-10-CM | POA: Insufficient documentation

## 2013-07-08 DIAGNOSIS — M2550 Pain in unspecified joint: Secondary | ICD-10-CM

## 2013-07-08 DIAGNOSIS — Z8742 Personal history of other diseases of the female genital tract: Secondary | ICD-10-CM | POA: Insufficient documentation

## 2013-07-08 DIAGNOSIS — Z85028 Personal history of other malignant neoplasm of stomach: Secondary | ICD-10-CM | POA: Insufficient documentation

## 2013-07-08 DIAGNOSIS — R609 Edema, unspecified: Secondary | ICD-10-CM | POA: Insufficient documentation

## 2013-07-08 DIAGNOSIS — R61 Generalized hyperhidrosis: Secondary | ICD-10-CM | POA: Insufficient documentation

## 2013-07-08 DIAGNOSIS — I10 Essential (primary) hypertension: Secondary | ICD-10-CM | POA: Insufficient documentation

## 2013-07-08 LAB — CBC WITH DIFFERENTIAL/PLATELET
Eosinophils Absolute: 0.2 10*3/uL (ref 0.0–0.7)
Hemoglobin: 11.2 g/dL — ABNORMAL LOW (ref 12.0–15.0)
Lymphocytes Relative: 42 % (ref 12–46)
Lymphs Abs: 2.4 10*3/uL (ref 0.7–4.0)
MCH: 26.1 pg (ref 26.0–34.0)
Monocytes Relative: 5 % (ref 3–12)
Neutrophils Relative %: 50 % (ref 43–77)
RBC: 4.29 MIL/uL (ref 3.87–5.11)
WBC: 5.8 10*3/uL (ref 4.0–10.5)

## 2013-07-08 MED ORDER — OXYCODONE-ACETAMINOPHEN 5-325 MG PO TABS
2.0000 | ORAL_TABLET | Freq: Once | ORAL | Status: AC
  Administered 2013-07-08: 2 via ORAL
  Filled 2013-07-08 (×2): qty 2

## 2013-07-08 NOTE — ED Notes (Signed)
Pt. reports bilateral knee pain for > 2 months , denies injury or fall , ambulatory , pt. also reports arms cramping when sleeping.

## 2013-07-08 NOTE — ED Provider Notes (Signed)
CSN: [id number]     Arrival date & time 07/08/13  2256 History   First MD Initiated Contact with Patient 07/08/13 2315     Chief Complaint  Patient presents with  . Knee Pain   HPI  History provided by the patient. Patient is a 52 year old female with history of neoplastic stomach tumor, hypertension who presents with complaints of continued ongoing joint and body pains. Patient states she's had waxing and waning pains to her joints especially her knees and upper arms for the past 3 months. Her symptoms are generally worse at night to the point where her arms and legs are very stiff and she cannot bend or move at the joints normally. She has also had significant night sweats and occasional headache symptoms. Patient has been concern for these symptoms but has not been taking any medications for the pain. She did discuss her symptoms with her cancer surgeon who recommended her be evaluated in the emergency room if symptoms are worsened. She states her primary care provider has not done any investigation into why she has the symptoms. She denies any past history of arthritis. There is no family history of arthritis. She denies any fevers. She denies any chest pains or shortness of breath. She denies any weight changes. No changes in appetite.   Past Medical History  Diagnosis Date  . 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 Hypertension   . Dermoid cyst of ovary   . Fibroids   . Malignant neoplasm of stomach, unspecified site 11/11/2011   Past Surgical History  Procedure Laterality Date  . Bilroth ii procedure  02/05/10    adenocarcinoma of antrum, Dr. Lindie Spruce  . Portacath placement  03/01/2010    Dr. Lindie Spruce  . Esophagogastroduodenoscopy  2011    Adenocarcinoma, stomach. Dr. Evette Cristal  . Colonoscopy  06/10/2011    Normal  . Port-a-cath removal  11/04/2011    Procedure: MINOR REMOVAL PORT-A-CATH;  Surgeon: Jetty Duhamel, MD;  Location: [facility];  Service: General;  Laterality: Left;   Family History  Problem Relation Age of Onset  . Colon cancer Neg Hx   . Stomach cancer Neg Hx    History  Substance Use Topics  . Smoking status: Never Smoker   . Smokeless tobacco: Never Used  . Alcohol Use: Yes     Comment: occasionally   OB History   Grav Para Term Preterm Abortions TAB SAB Ect Mult Living                 Review of Systems  Constitutional: Positive for diaphoresis. Negative for fever, appetite change and unexpected weight change.  Respiratory: Negative for shortness of breath.   Cardiovascular: Negative for chest pain.  Musculoskeletal: Positive for arthralgias.  All other systems reviewed and are negative.    Allergies  Niacin and related  Home Medications   Current Outpatient Rx  Name  Route  Sig  Dispense  Refill  . Iron-Folic Acid-Vit B12 (IRON FORMULA PO)   Oral   Take 1 capsule by mouth daily. With  Vit  C.         . pantoprazole (PROTONIX) 40 MG tablet   Oral   Take 1 tablet (40 mg total) by mouth 2 (two) times daily.   60 tablet   0     Patient will need to follow up with PCP for future ...   . pravastatin (PRAVACHOL) 20 MG  tablet   Oral   Take 20 mg by mouth at bedtime.           . vitamin B-12 (CYANOCOBALAMIN) 1000 MCG tablet   Oral   Take 3,000 mcg by mouth daily.          BP 127/86  Pulse 83  Temp(Src) 98.2 F (36.8 C) (Oral)  Resp 16  SpO2 97% Physical Exam  Nursing note and vitals reviewed. Constitutional: She is oriented to person, place, and time. She appears well-developed and well-nourished. No distress.  HENT:  Head: Normocephalic and atraumatic.  Eyes: Conjunctivae and EOM are normal. Pupils are equal, round, and reactive to light.  Cardiovascular: Normal rate and regular rhythm.   Pulmonary/Chest: Effort normal and breath sounds normal. No respiratory distress. She has no wheezes. She has no rales.  Abdominal: Soft.  Musculoskeletal: Normal  range of motion. She exhibits edema.  Mild swelling to the bilateral hands. Normal range of motion. There is no erythema or increased warmth over the joints of the hand and fingers. Normal grip strength bilaterally. Normal pulses capillary refill and sensations.  Normal range of motion of bilateral knees. Skin is normal without erythema or increased warmth. There is mild swelling bilaterally. There is also mild swelling of the lower legs and ankles. No pain or tenderness along the calves or thighs. Normal distal pulses and sensations.  Neurological: She is alert and oriented to person, place, and time.  Skin: Skin is warm and dry. No rash noted.  Psychiatric: She has a normal mood and affect. Her behavior is normal.    ED Course  Procedures   Results for orders placed during the hospital encounter of 07/08/13  CBC WITH DIFFERENTIAL      Result Value Range   WBC 5.8  4.0 - 10.5 K/uL   RBC 4.29  3.87 - 5.11 MIL/uL   Hemoglobin 11.2 (*) 12.0 - 15.0 g/dL   HCT 16.1 (*) 09.6 - 04.5 %   MCV 80.7  78.0 - 100.0 fL   MCH 26.1  26.0 - 34.0 pg   MCHC 32.4  30.0 - 36.0 g/dL   RDW 40.9  81.1 - 91.4 %   Platelets 236  150 - 400 K/uL   Neutrophils Relative % 50  43 - 77 %   Neutro Abs 2.9  1.7 - 7.7 K/uL   Lymphocytes Relative 42  12 - 46 %   Lymphs Abs 2.4  0.7 - 4.0 K/uL   Monocytes Relative 5  3 - 12 %   Monocytes Absolute 0.3  0.1 - 1.0 K/uL   Eosinophils Relative 3  0 - 5 %   Eosinophils Absolute 0.2  0.0 - 0.7 K/uL   Basophils Relative 0  0 - 1 %   Basophils Absolute 0.0  0.0 - 0.1 K/uL  SEDIMENTATION RATE      Result Value Range   Sed Rate 25 (*) 0 - 22 mm/hr  URIC ACID      Result Value Range   Uric Acid, Serum 7.8 (*) 2.4 - 7.0 mg/dL  COMPREHENSIVE METABOLIC PANEL      Result Value Range   Sodium 138  135 - 145 mEq/L   Potassium 4.1  3.5 - 5.1 mEq/L   Chloride 101  96 - 112 mEq/L   CO2 24  19 - 32 mEq/L   Glucose, Bld 99  70 - 99 mg/dL   BUN 22  6 - 23 mg/dL   Creatinine,  Ser 7.82  0.50 - 1.10 mg/dL   Calcium 9.6  8.4 - 62.1 mg/dL   Total Protein 8.0  6.0 - 8.3 g/dL   Albumin 4.1  3.5 - 5.2 g/dL   AST 22  0 - 37 U/L   ALT 21  0 - 35 U/L   Alkaline Phosphatase 130 (*) 39 - 117 U/L   Total Bilirubin 0.1 (*) 0.3 - 1.2 mg/dL   GFR calc non Af Amer 68 (*) >90 mL/min   GFR calc Af Amer 79 (*) >90 mL/min        MDM   1. Arthralgia       11:30PM patient seen and evaluated. The patient appears well no acute distress. Her symptoms have been chronic for the past 3 months.   Pt seen and evaluated with Attending Physician.  He agrees with work up and plan.    Angus Seller, PA-C 07/09/13 [phone]

## 2013-07-09 LAB — COMPREHENSIVE METABOLIC PANEL
ALT: 21 U/L (ref 0–35)
AST: 22 U/L (ref 0–37)
Albumin: 4.1 g/dL (ref 3.5–5.2)
Alkaline Phosphatase: 130 U/L — ABNORMAL HIGH (ref 39–117)
CO2: 24 mEq/L (ref 19–32)
Chloride: 101 mEq/L (ref 96–112)
Creatinine, Ser: 0.94 mg/dL (ref 0.50–1.10)
GFR calc non Af Amer: 68 mL/min — ABNORMAL LOW (ref >90)
Potassium: 4.1 mEq/L (ref 3.5–5.1)
Sodium: 138 mEq/L (ref 135–145)
Total Bilirubin: 0.1 mg/dL — ABNORMAL LOW (ref 0.3–1.2)

## 2013-07-09 LAB — SEDIMENTATION RATE: Sed Rate: 25 mm/hr — ABNORMAL HIGH (ref 0–22)

## 2013-07-09 LAB — URIC ACID: Uric Acid, Serum: 7.8 mg/dL — ABNORMAL HIGH (ref 2.4–7.0)

## 2013-07-09 MED ORDER — NAPROXEN 500 MG PO TABS: 500.0000 mg | ORAL_TABLET | Freq: Two times a day (BID) | ORAL | Status: DC

## 2013-07-09 NOTE — ED Provider Notes (Signed)
Medical screening examination/treatment/procedure(s) were conducted as a shared visit with non-physician practitioner(s) and myself.  I personally evaluated the patient during the encounter.   52 year old female with joint pains, particularly in her bilateral knees. Has been going on for several weeks. Knees are not warm, swollen, tender. Full range of motion of both knees. In general she is nontoxic, well-appearing, normal respirations, normal perfusion. Her symptoms likely represent arthritis. However, she will need to followup with her primary doctor for further evaluation. Will start naproxen.  Clinical Impression: 1. Arthralgia       Candyce Churn, MD 07/09/13 [phone]

## 2013-07-10 NOTE — ED Provider Notes (Signed)
Medical screening examination/treatment/procedure(s) were conducted as a shared visit with non-physician practitioner(s) and myself.  I personally evaluated the patient during the encounter.   Please see my separate note.      Candyce Churn, MD 07/10/13 [phone]

## 2013-08-23 ENCOUNTER — Ambulatory Visit (HOSPITAL_BASED_OUTPATIENT_CLINIC_OR_DEPARTMENT_OTHER): Payer: Managed Care, Other (non HMO) | Admitting: Oncology

## 2013-08-23 ENCOUNTER — Encounter (INDEPENDENT_AMBULATORY_CARE_PROVIDER_SITE_OTHER): Payer: Self-pay

## 2013-08-23 ENCOUNTER — Other Ambulatory Visit: Payer: Managed Care, Other (non HMO) | Admitting: Lab

## 2013-08-23 ENCOUNTER — Telehealth: Payer: Self-pay | Admitting: Oncology

## 2013-08-23 VITALS — BP 128/79 | HR 70 | Temp 98.4°F | Resp 18 | Ht 68.0 in | Wt 225.0 lb

## 2013-08-23 DIAGNOSIS — D649 Anemia, unspecified: Secondary | ICD-10-CM

## 2013-08-23 DIAGNOSIS — C169 Malignant neoplasm of stomach, unspecified: Secondary | ICD-10-CM

## 2013-08-23 DIAGNOSIS — I10 Essential (primary) hypertension: Secondary | ICD-10-CM

## 2013-08-23 DIAGNOSIS — M25559 Pain in unspecified hip: Secondary | ICD-10-CM

## 2013-08-23 NOTE — Telephone Encounter (Signed)
Gv pt appt schedule for August.

## 2013-08-23 NOTE — Progress Notes (Signed)
   [facility]    OFFICE PROGRESS NOTE   INTERVAL HISTORY:   She returns for scheduled followup of gastric cancer. Her chief complaint is of bilateral knee pain is worse when getting up from a sitting position. Intermittent left upper quadrant pain after eating certain foods such as rice and plantains. She reports "sweats "daily. The menstrual cycle is now irregular. Objective:  Vital signs in last 24 hours:  Blood pressure 128/79, pulse 70, temperature 98.4 F (36.9 C), temperature source Oral, resp. rate 18, height 5\' 8"  (1.727 m), weight 225 lb (102.059 kg), SpO2 100.00%.    HEENT: Neck without mass Lymphatics: No cervical, supra-clavicular, axillary, or inguinal nodes Resp: Lungs clear bilaterally Cardio: Regular rate and rhythm GI: No hepatomegaly, nontender, no mass Vascular: No leg edema   Lab Results:  Lab Results  Component Value Date   WBC 5.8 07/08/2013   HGB 11.2* 07/08/2013   HCT 34.6* 07/08/2013   MCV 80.7 07/08/2013   PLT 236 07/08/2013   ANC 2.9  CEA on 08/23/2013-0.8  Medications: I have reviewed the patient's current medications.  Assessment/Plan: 1. Stage III (T3 N2) gastric cancer; status post subtotal gastrectomy 02/05/2010 with pathology showing a poorly differentiated, invasive adenocarcinoma, spanning 2.5 cm, with extension into and focally through the muscularis propria; metastatic carcinoma identified in 3 of 20 lymph nodes. She completed adjuvant therapy with infusional 5-fluorouracil followed by Xeloda and radiation and additional infusional 5-fluorouracil. Restaging CT scans chest/abdomen/pelvis 11/23/2012 with no evidence of metastatic disease. CEA normal 08/23/2013 2. Hypertension. 3. Hypercholesterolemia. 4. Knee arthralgias-I recommended she followup with Dr. Mayford Knife to consider the need for an orthopedic referral 5. Mild microcytic anemia-chronic,? Related to menstrual blood loss/iron deficiency  Disposition:  She remains  in clinical remission from gastric cancer. She is now greater than 3-1/2 years out from surgery. She will return for an office and lab visit in 9 months. We will see her in the interim for new symptoms.   Thornton Papas, MD  08/23/2013  3:06 PM

## 2013-08-25 ENCOUNTER — Other Ambulatory Visit: Payer: Self-pay | Admitting: *Deleted

## 2013-08-25 ENCOUNTER — Telehealth: Payer: Self-pay | Admitting: *Deleted

## 2013-08-25 NOTE — Telephone Encounter (Addendum)
Informed patient of normal CEA. Per Dr. Truett Perna. Patient asked about medication to increase her appetite.  Patient informed this nurse that she received prescription from another doctor.  Informed patient she should call prescribing doctor for refill.

## 2013-08-25 NOTE — Telephone Encounter (Signed)
Message copied by Raphael Gibney on Wed Aug 25, 2013  1:20 PM ------      Message from: Wandalee Ferdinand      Created: Wed Aug 25, 2013  1:18 PM                   ----- Message -----         From: Ladene Artist, MD         Sent: 08/23/2013   7:10 PM           To: Wandalee Ferdinand, RN, Glori Luis, RN, #            Please call patient, cea is normal ------

## 2013-08-26 ENCOUNTER — Telehealth: Payer: Self-pay | Admitting: *Deleted

## 2013-08-26 NOTE — Telephone Encounter (Signed)
Confirmed with pharmacy that the medication patient is asking for is Megace and that the prescriber is her PCP, Dr. Lerry Liner. They have sent a faxed refill request to his office today. Called patient and made her aware that the script came from Dr. Mayford Knife and not Dr. Truett Perna (she has been calling the wrong office). She will follow up with Dr. Mayford Knife tomorrow.

## 2013-11-09 IMAGING — CT CT CHEST W/ CM
2 of 5 series · 11 of 36 positions shown, 18 images · IV contrast (READICAT/WATER & [ID] OMNI 300)
Comparison: Chest CT 12/03/2010, PET CT 03/02/2010, CT
abdomen/pelvis 04/01/2011

CT CHEST

CLINICAL DATA: Gastric cancer diagnosed 1666 status post partial
gastrectomy.  Completed radiation therapy and chemotherapy.  Most
recent chemotherapy November 2009.

CT CHEST, ABDOMEN AND PELVIS WITH CONTRAST
TECHNIQUE: Multidetector CT imaging of the chest, abdomen and
pelvis was performed following the standard protocol during bolus
administration of intravenous contrast.

[Series 601: coronal body · coronal · 1.25mm/px · 1 of 125 slices shown, 2 images]
[im 42/125  soft-tissue]
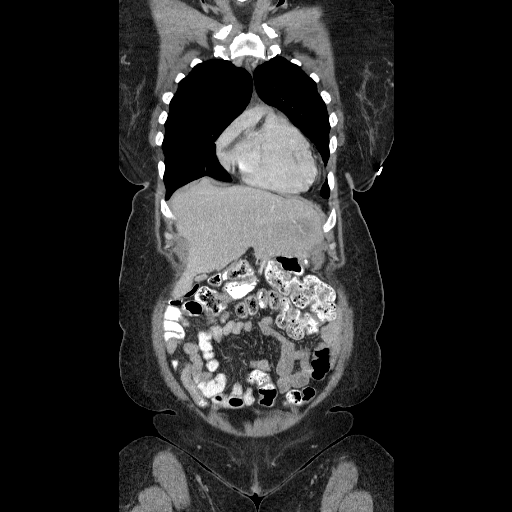
[im 42/125  bone]
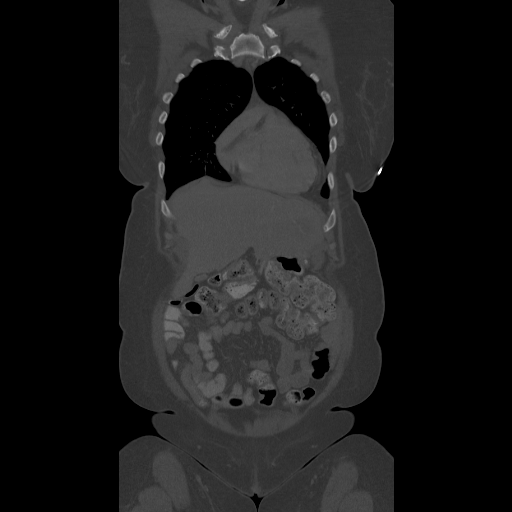

[Series 602: sagittal body · sagittal · 1.25mm/px · 10 of 145 slices shown, 16 images]
[im 13/145  soft-tissue]
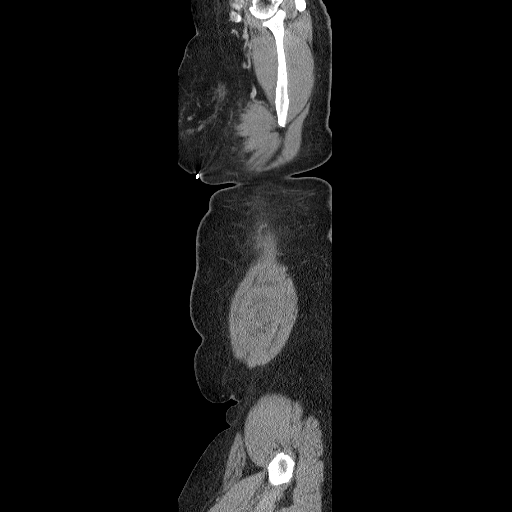
[im 13/145  lung]
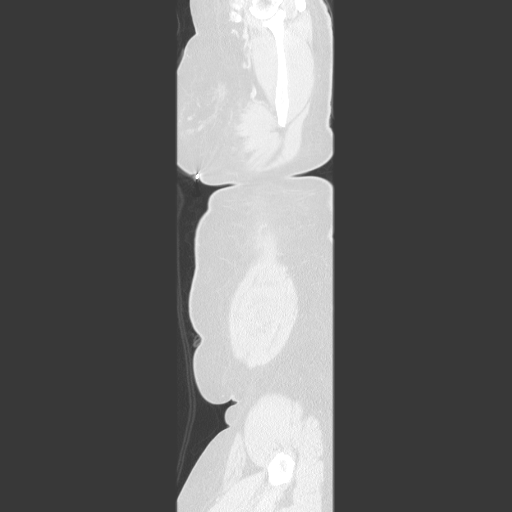
[im 13/145  bone]
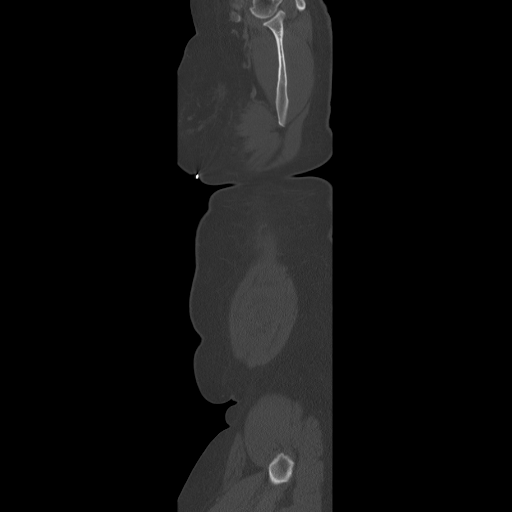
[im 25/145  soft-tissue]
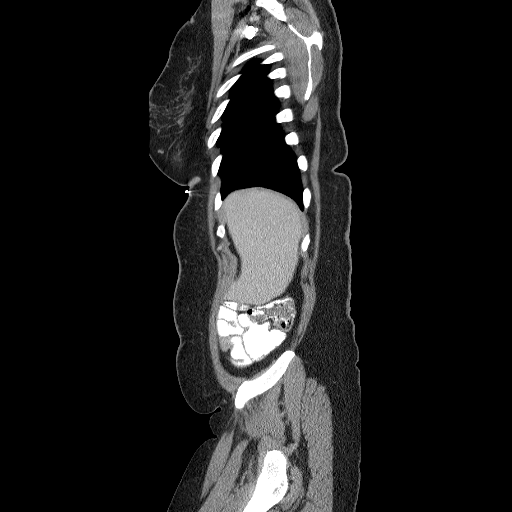
[im 25/145  lung]
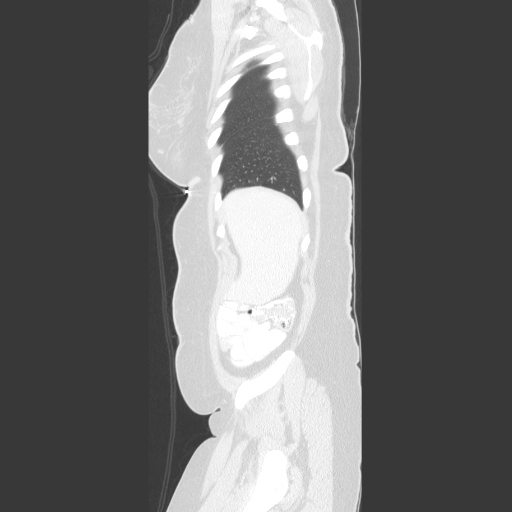
[im 37/145  soft-tissue]
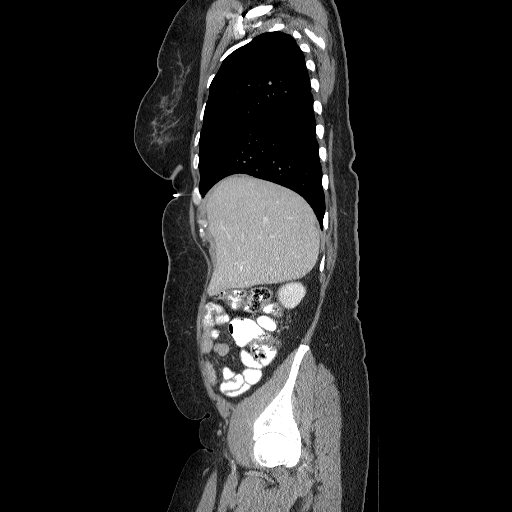
[im 37/145  lung]
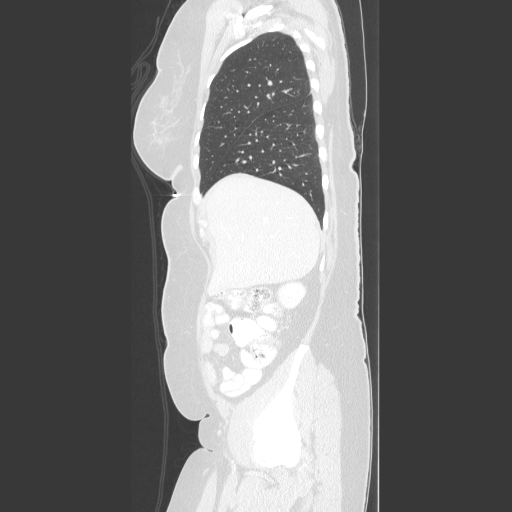
[im 49/145  soft-tissue]
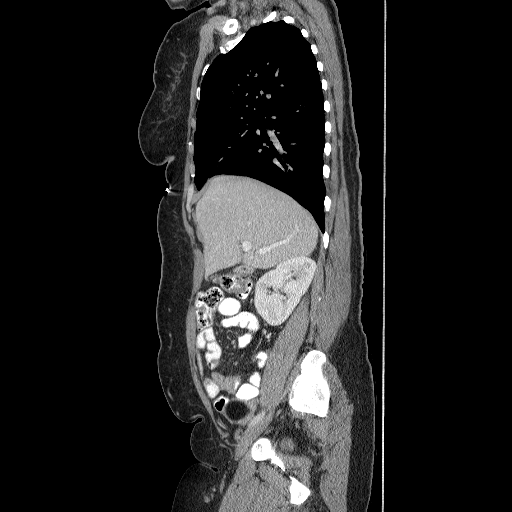
[im 49/145  lung]
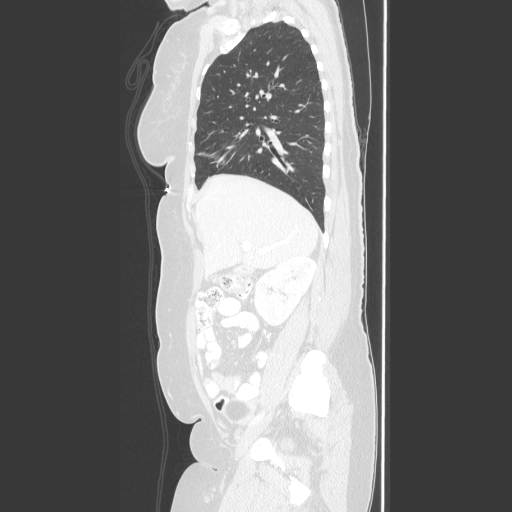
[im 61/145  soft-tissue]
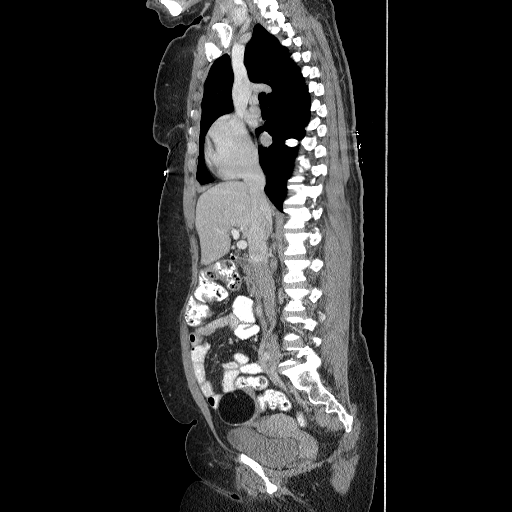
[im 85/145  soft-tissue]
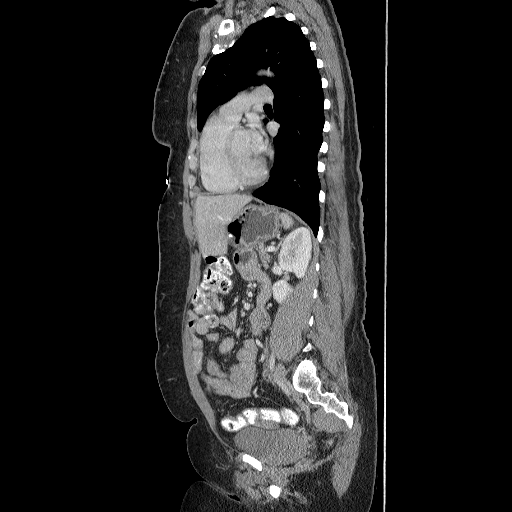
[im 97/145  soft-tissue]
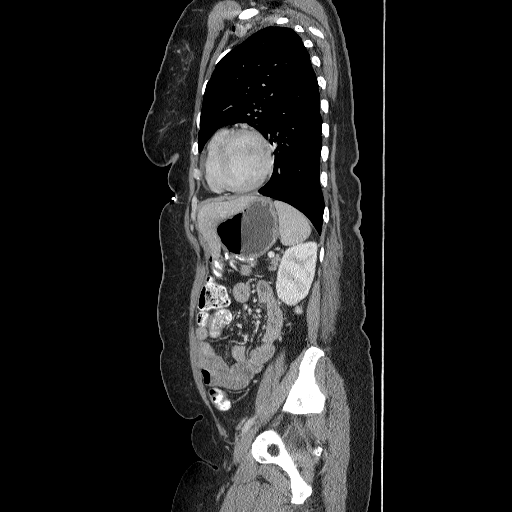
[im 109/145  soft-tissue]
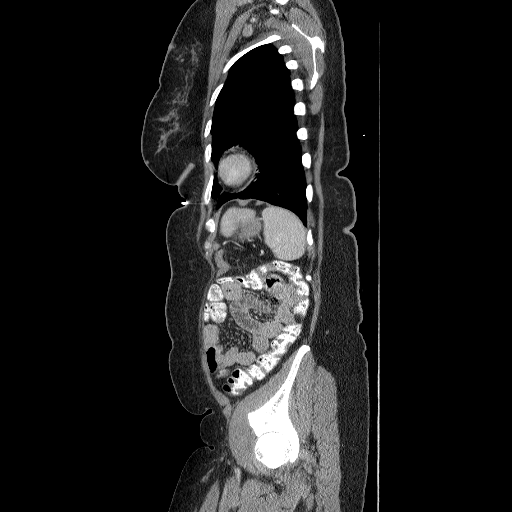
[im 121/145  soft-tissue]
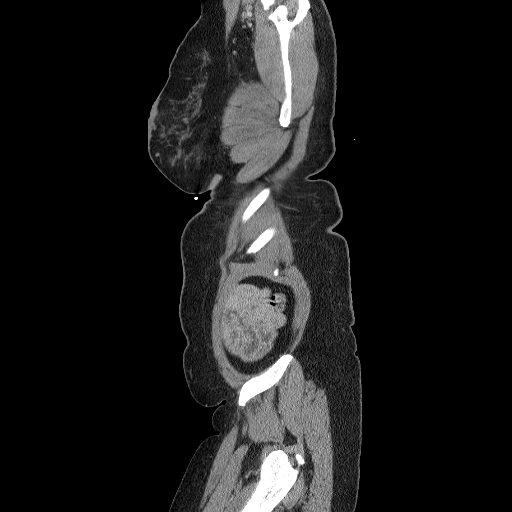
[im 121/145  bone]
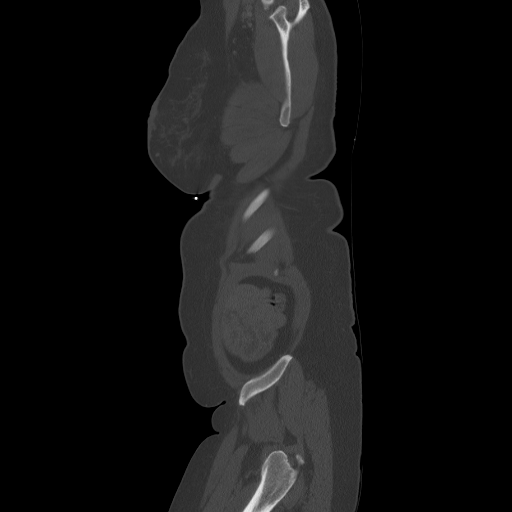
[im 133/145  soft-tissue]
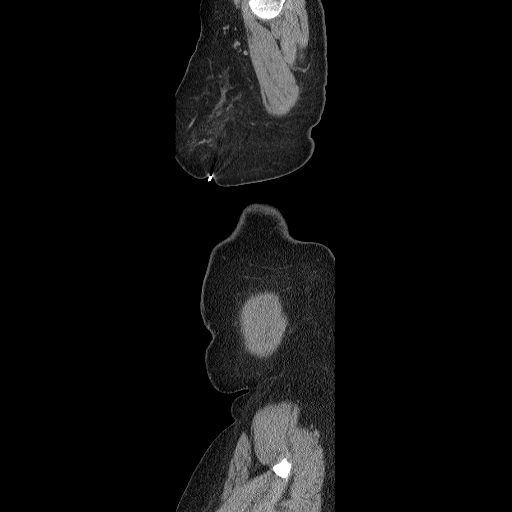

[11 of 36 positions shown; findings below may reference images not displayed]

Contrast: 100mL OMNIPAQUE IOHEXOL 300 MG/ML IV SOLN. The
technologist reported approximately 9 ml of contrast infiltrated at
the left antecubital fossa IV site and was assessed by the
technologist and no further treatment of beyond conservative
measures at was deemed necessary by observation and per protocol.
The technologist discussed this with the patient at the time of the
exam.
FINDINGS: The thyroid is normal in appearance.  Presumed left
upper breast 2 cm seroma after recent Port-A-Cath removal.  Heart
size is normal.  Trace pericardial fluid is present.  Esophagus is
normal by CT.  No mediastinal, hilar, or axillary lymphadenopathy.
Mild right atrial prominence noted. New 4 mm left upper lobe
pulmonary nodule is identified on image 15.  Mild disc degenerative
changes of the spine are again noted.  Central airways are patent.
IMPRESSION: New 4 mm left upper lobe pulmonary nodule.  In the absence of other
evidence to suggest metastatic disease, this would be an unusual
solitary manifestation of metastasis.  This could also represent a
focus of inflammation/infection.  Follow-up chest CT is recommended
in 3 months.

CT ABDOMEN AND PELVIS
FINDINGS: Evidence of partial gastrectomy noted.  Allowing for
technique, no mass is identified at the resection site.
Gallbladder is presumed contracted but otherwise unremarkable.  A
small ventral hernia is identified admitting a knuckle of
transverse colon without evidence for obstruction, image 68.  Small
fat containing umbilical hernia again noted, with possible trace
fluid, unchanged.  Spleen, adrenal glands, kidneys and pancreas are
unremarkable.  No abdominal or retroperitoneal lymphadenopathy is
identified.

Uterus, left ovary, and partially contrast opacified bladder are
normal. 6 cm right ovarian dermoid again noted.  No bowel wall
thickening or focal segmental dilatation is identified.  No new
acute osseous finding or lytic or sclerotic osseous lesion.
IMPRESSION: No CT evidence for intra-abdominal or pelvic metastatic disease or
acute finding.

Stable right ovarian dermoid.

## 2014-05-23 ENCOUNTER — Ambulatory Visit: Payer: Managed Care, Other (non HMO) | Admitting: Nurse Practitioner

## 2014-10-28 ENCOUNTER — Telehealth: Payer: Self-pay | Admitting: *Deleted

## 2014-10-28 NOTE — Telephone Encounter (Signed)
Received call from pt's daughter Nancy [name] stating "I'm really upset with trying to get a hold of a person"  She states that she has called for 2 weeks trying to get letter/medical record to send to her school re: "mother's diagnosis and treatment" at [facility].  "The deadline for my school is today"  When asked specifically what information she needed, states "the diagnosis and medical treatment from beginning to end, from [place] to [place] of when she was given treatment"  Explained to Nancy [name] that I will be glad to look into this for her and contact her this afternoon with an update.  Nancy [name] verbalized understanding.  Note to Dr. Benay Spice and spoke with Tiffany in HIM for assistance as well.

## 2014-11-03 NOTE — Telephone Encounter (Signed)
Message to [place] in HIM asking if this has been followed up on yet?

## 2017-06-02 ENCOUNTER — Other Ambulatory Visit: Payer: Self-pay | Admitting: Physician Assistant

## 2017-06-02 DIAGNOSIS — E010 Iodine-deficiency related diffuse (endemic) goiter: Secondary | ICD-10-CM

## 2017-06-10 ENCOUNTER — Ambulatory Visit
Admission: RE | Admit: 2017-06-10 | Discharge: 2017-06-10 | Disposition: A | Discharge status: Home / Self Care | Payer: No Typology Code available for payment source | Source: Ambulatory Visit | Attending: Physician Assistant | Admitting: Physician Assistant

## 2017-06-10 DIAGNOSIS — E010 Iodine-deficiency related diffuse (endemic) goiter: Secondary | ICD-10-CM

## 2017-10-01 ENCOUNTER — Encounter (HOSPITAL_COMMUNITY): Payer: Self-pay | Admitting: Emergency Medicine

## 2017-10-01 ENCOUNTER — Emergency Department (HOSPITAL_COMMUNITY)
Admission: EM | Admit: 2017-10-01 | Discharge: 2017-10-01 | Disposition: A | Discharge status: Home / Self Care | Payer: Self-pay | Attending: Emergency Medicine | Admitting: Emergency Medicine

## 2017-10-01 ENCOUNTER — Other Ambulatory Visit: Payer: Self-pay

## 2017-10-01 DIAGNOSIS — Z85028 Personal history of other malignant neoplasm of stomach: Secondary | ICD-10-CM | POA: Insufficient documentation

## 2017-10-01 DIAGNOSIS — T7840XA Allergy, unspecified, initial encounter: Secondary | ICD-10-CM | POA: Insufficient documentation

## 2017-10-01 DIAGNOSIS — Z79899 Other long term (current) drug therapy: Secondary | ICD-10-CM | POA: Insufficient documentation

## 2017-10-01 DIAGNOSIS — I10 Essential (primary) hypertension: Secondary | ICD-10-CM | POA: Insufficient documentation

## 2017-10-01 MED ORDER — PREDNISONE 20 MG PO TABS: 40.0000 mg | ORAL_TABLET | Freq: Every day | ORAL | 0 refills | Status: DC

## 2017-10-01 MED ORDER — DIPHENHYDRAMINE HCL 25 MG PO TABS: 25.0000 mg | ORAL_TABLET | Freq: Four times a day (QID) | ORAL | 0 refills | Status: DC

## 2017-10-01 MED ORDER — FAMOTIDINE 20 MG PO TABS: 20.0000 mg | ORAL_TABLET | Freq: Two times a day (BID) | ORAL | 0 refills | Status: DC

## 2017-10-01 NOTE — ED Provider Notes (Signed)
[facility] EMERGENCY DEPARTMENT Provider Note   CSN: [id number] Arrival date & time: 10/01/17  0034     History   Chief Complaint Chief Complaint  Patient presents with  . Facial Swelling    HPI Nancy [name] is a 56 y.o. female.  HPI  This a 56 year old female with a history of hypertension, adenocarcinoma who presents with itchy scalp and facial swelling.  Patient reports 3-4-day history of worsening itching and swelling about the bilateral eyes.  She states that she used a [place] oil on her hair and has since had these symptoms.  She has used oil before but this 1 had a different additive.  She states that she washed her scalp this morning hoping it would help but it did not.  She did take Benadryl with minimal relief.  Denies any difficulty swallowing or shortness of breath.  Denies itching or rash anywhere else.  Past Medical History:  Diagnosis Date  . Dermoid cyst of ovary   . Fibroids   . Hypertension   . Malignant neoplasm of stomach, unspecified site 11/11/2011  . 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 Malignant neoplasm of stomach, unspecified site 11/11/2011  . Painful Port-a-cath 10/22/2011  . Anemia 05/20/2011  . Dermoid cyst of ovary 04/13/2011  . Fibroids 04/13/2011  . History of Adenocarcinoma of stomach 04/13/2011    Class: History of    Past Surgical History:  Procedure Laterality Date  . BILROTH II PROCEDURE  02/05/10   adenocarcinoma of antrum, Dr. Hulen Skains  . COLONOSCOPY  06/10/2011   Normal  . ESOPHAGOGASTRODUODENOSCOPY  2011   Adenocarcinoma, stomach. Dr. Penelope Coop  . PORT-A-CATH REMOVAL  11/04/2011   Procedure: MINOR REMOVAL PORT-A-CATH;  Surgeon: Belva Crome, MD;  Location: [facility];  Service: General;  Laterality: Left;  . PORTACATH PLACEMENT  03/01/2010   Dr. Hulen Skains    OB History    No data  available       Home Medications    Prior to Admission medications   Medication Sig Start Date End Date Taking? Authorizing Provider  diphenhydrAMINE (BENADRYL) 25 MG tablet Take 1 tablet (25 mg total) by mouth every 6 (six) hours. 10/01/17   Princesa Willig, Barbette Hair, MD  famotidine (PEPCID) 20 MG tablet Take 1 tablet (20 mg total) by mouth 2 (two) times daily. 10/01/17   Eiden Bagot, Barbette Hair, MD  Iron-Folic Acid-Vit N27 (IRON FORMULA PO) Take 1 capsule by mouth daily. With  Vit  C.    [provider]  naproxen (NAPROSYN) 500 MG tablet Take 1 tablet (500 mg total) by mouth 2 (two) times daily. 07/09/13   Hazel Sams, PA-C  pantoprazole (PROTONIX) 40 MG tablet Take 1 tablet (40 mg total) by mouth 2 (two) times daily. 08/22/12   Linton Flemings, MD  pravastatin (PRAVACHOL) 20 MG tablet Take 20 mg by mouth at bedtime.      [provider]  predniSONE (DELTASONE) 20 MG tablet Take 2 tablets (40 mg total) by mouth daily. 10/01/17   Tambria Pfannenstiel, Barbette Hair, MD  vitamin B-12 (CYANOCOBALAMIN) 1000 MCG tablet Take 1,000 mcg by mouth daily.     [provider]    Family History Family History  Problem Relation Age of Onset  . Colon cancer Neg Hx   . Stomach cancer Neg Hx     Social History Social History  Tobacco Use  . Smoking status: Never Smoker  . Smokeless tobacco: Never Used  Substance Use Topics  . Alcohol use: Yes    Comment: occasionally  . Drug use: No     Allergies   Niacin and related   Review of Systems Review of Systems  Constitutional: Negative for fever.  HENT: Negative for trouble swallowing.   Respiratory: Negative for shortness of breath.   Skin: Positive for rash.  All other systems reviewed and are negative.    Physical Exam Updated Vital Signs BP 128/87 (BP Location: Right Arm)   Pulse 65   Temp 97.7 F (36.5 C) (Oral)   Resp 16   SpO2 98%   Physical Exam  Constitutional: She is oriented to person, place, and time. She appears  well-developed and well-nourished.  ABCs intact  HENT:  Head: Normocephalic and atraumatic.  Mouth/Throat: Oropharynx is clear and moist.  Mild discoloration noted of the face and bilateral cheeks, no scalp lesions or discoloration noted  Eyes: Pupils are equal, round, and reactive to light.  Mild swelling noted of the bilateral eyes  Cardiovascular: Normal rate, regular rhythm and normal heart sounds.  Pulmonary/Chest: Effort normal and breath sounds normal. No respiratory distress. She has no wheezes.  Abdominal: Soft. There is no tenderness.  Neurological: She is alert and oriented to person, place, and time.  Skin: Skin is warm and dry.  Psychiatric: She has a normal mood and affect.  Nursing note and vitals reviewed.    ED Treatments / Results  Labs (all labs ordered are listed, but only abnormal results are displayed) Labs Reviewed - No data to display  EKG  EKG Interpretation None       Radiology No results found.  Procedures Procedures (including critical care time)  Medications Ordered in ED Medications - No data to display   Initial Impression / Assessment and Plan / ED Course  I have reviewed the triage vital signs and the nursing notes.  Pertinent labs & imaging results that were available during my care of the patient were reviewed by me and considered in my medical decision making (see chart for details).     Patient presents with concern for reaction to a topical oil she is a used on her hair.  She is in no acute distress.  No significant rash.  Mild discoloration noted to the face.  Scalp is generally dry.  She reports itching.  We will treat for contact dermatitis.  Given location, will give steroids, Benadryl, Pepcid.  No signs or symptoms of systemic allergic reaction or anaphylaxis.  After history, exam, and medical workup I feel the patient has been appropriately medically screened and is safe for discharge home. Pertinent diagnoses were discussed  with the patient. Patient was given return precautions.   Final Clinical Impressions(s) / ED Diagnoses   Final diagnoses:  Allergic reaction, initial encounter    ED Discharge Orders        Ordered    diphenhydrAMINE (BENADRYL) 25 MG tablet  Every 6 hours     10/01/17 0245    famotidine (PEPCID) 20 MG tablet  2 times daily     10/01/17 0245    predniSONE (DELTASONE) 20 MG tablet  Daily     10/01/17 0245       Merryl Hacker, MD 10/01/17 [phone]

## 2017-10-01 NOTE — ED Triage Notes (Addendum)
Pt speak [place], [place], [place].  She did not want to use interpertor however while RN could understand the words the ideas were not being expressed in a way that could be understood.  Pt reports that she feels she is allergic to "something."  States her face/eyes and scalp all itch.  She also reports a face rash.  States she noticed that her pupils were small yesterday but are fine today, no neuro symptoms noted. "feels like something is crawling under my scalp.

## 2017-10-01 NOTE — ED Notes (Signed)
See EDP assessment 

## 2017-10-01 NOTE — Discharge Instructions (Signed)
Avoid any new shampoos, soaps, lotions.  Follow-up in 3-4 days if not improving.

## 2017-10-01 NOTE — ED Notes (Signed)
Pt verbalizes understanding of d/c instructions. Pt received prescriptions. Pt ambulatory at d/c with all belongings.  

## 2017-10-01 NOTE — ED Notes (Signed)
ED Provider at bedside. 

## 2017-12-12 ENCOUNTER — Encounter (HOSPITAL_COMMUNITY): Payer: Self-pay | Admitting: Emergency Medicine

## 2017-12-12 ENCOUNTER — Other Ambulatory Visit: Payer: Self-pay

## 2017-12-12 ENCOUNTER — Observation Stay (HOSPITAL_COMMUNITY): Payer: Self-pay

## 2017-12-12 ENCOUNTER — Emergency Department (HOSPITAL_COMMUNITY): Payer: Self-pay

## 2017-12-12 ENCOUNTER — Inpatient Hospital Stay (HOSPITAL_COMMUNITY)
Admission: EM | Admit: 2017-12-12 | Discharge: 2017-12-16 | DRG: 390 | Disposition: A | Discharge status: Home / Self Care | Payer: Self-pay | Attending: Surgery | Admitting: Surgery

## 2017-12-12 DIAGNOSIS — Z903 Acquired absence of stomach [part of]: Secondary | ICD-10-CM

## 2017-12-12 DIAGNOSIS — I10 Essential (primary) hypertension: Secondary | ICD-10-CM | POA: Diagnosis present

## 2017-12-12 DIAGNOSIS — Z79899 Other long term (current) drug therapy: Secondary | ICD-10-CM

## 2017-12-12 DIAGNOSIS — Z0189 Encounter for other specified special examinations: Secondary | ICD-10-CM

## 2017-12-12 DIAGNOSIS — K56609 Unspecified intestinal obstruction, unspecified as to partial versus complete obstruction: Secondary | ICD-10-CM

## 2017-12-12 DIAGNOSIS — Z9221 Personal history of antineoplastic chemotherapy: Secondary | ICD-10-CM

## 2017-12-12 DIAGNOSIS — Z85028 Personal history of other malignant neoplasm of stomach: Secondary | ICD-10-CM

## 2017-12-12 DIAGNOSIS — Z791 Long term (current) use of non-steroidal anti-inflammatories (NSAID): Secondary | ICD-10-CM

## 2017-12-12 DIAGNOSIS — R339 Retention of urine, unspecified: Secondary | ICD-10-CM | POA: Diagnosis present

## 2017-12-12 DIAGNOSIS — K565 Intestinal adhesions [bands], unspecified as to partial versus complete obstruction: Principal | ICD-10-CM | POA: Diagnosis present

## 2017-12-12 HISTORY — DX: Unspecified intestinal obstruction, unspecified as to partial versus complete obstruction: K56.609

## 2017-12-12 LAB — COMPREHENSIVE METABOLIC PANEL
ALBUMIN: 4.2 g/dL (ref 3.5–5.0)
ALT: 17 U/L (ref 14–54)
AST: 29 U/L (ref 15–41)
Alkaline Phosphatase: 110 U/L (ref 38–126)
Anion gap: 11 (ref 5–15)
BUN: 20 mg/dL (ref 6–20)
CHLORIDE: 100 mmol/L — AB (ref 101–111)
CO2: 23 mmol/L (ref 22–32)
CREATININE: 0.93 mg/dL (ref 0.44–1.00)
Calcium: 9.9 mg/dL (ref 8.9–10.3)
GFR calc Af Amer: >60 mL/min (ref >60)
GFR calc non Af Amer: >60 mL/min (ref >60)
GLUCOSE: 146 mg/dL — AB (ref 65–99)
POTASSIUM: 5.1 mmol/L (ref 3.5–5.1)
Sodium: 134 mmol/L — ABNORMAL LOW (ref 135–145)
Total Bilirubin: 1 mg/dL (ref 0.3–1.2)
Total Protein: 7.7 g/dL (ref 6.5–8.1)

## 2017-12-12 LAB — CBC
HEMATOCRIT: 39.3 % (ref 36.0–46.0)
Hemoglobin: 12.4 g/dL (ref 12.0–15.0)
MCH: 25.3 pg — AB (ref 26.0–34.0)
MCHC: 31.6 g/dL (ref 30.0–36.0)
MCV: 80 fL (ref 78.0–100.0)
PLATELETS: 272 10*3/uL (ref 150–400)
RBC: 4.91 MIL/uL (ref 3.87–5.11)
RDW: 13.8 % (ref 11.5–15.5)
WBC: 7.3 10*3/uL (ref 4.0–10.5)

## 2017-12-12 LAB — URINALYSIS, ROUTINE W REFLEX MICROSCOPIC
BACTERIA UA: NONE SEEN
BILIRUBIN URINE: NEGATIVE
Glucose, UA: NEGATIVE mg/dL
Hgb urine dipstick: NEGATIVE
Ketones, ur: NEGATIVE mg/dL
LEUKOCYTES UA: NEGATIVE
Nitrite: NEGATIVE
Protein, ur: 100 mg/dL — AB
SPECIFIC GRAVITY, URINE: 1.031 — AB (ref 1.005–1.030)
pH: 7 (ref 5.0–8.0)

## 2017-12-12 LAB — LIPASE, BLOOD: LIPASE: 25 U/L (ref 11–51)

## 2017-12-12 MED ORDER — METOPROLOL TARTRATE 5 MG/5ML IV SOLN: 5.0000 mg | Freq: Four times a day (QID) | INTRAVENOUS | Status: DC | PRN

## 2017-12-12 MED ORDER — METHOCARBAMOL 1000 MG/10ML IJ SOLN
500.0000 mg | Freq: Three times a day (TID) | INTRAMUSCULAR | Status: DC | PRN
  Administered 2017-12-12 – 2017-12-13 (×2): 500 mg via INTRAVENOUS
  Filled 2017-12-12 (×5): qty 5

## 2017-12-12 MED ORDER — SODIUM CHLORIDE 0.9 % IV SOLN
25.0000 mg | Freq: Four times a day (QID) | INTRAVENOUS | Status: DC | PRN
  Administered 2017-12-13: 25 mg via INTRAVENOUS
  Filled 2017-12-12: qty 1

## 2017-12-12 MED ORDER — HYDROMORPHONE HCL 1 MG/ML IJ SOLN
0.5000 mg | Freq: Once | INTRAMUSCULAR | Status: AC
  Administered 2017-12-12: 0.5 mg via INTRAVENOUS
  Filled 2017-12-12: qty 1

## 2017-12-12 MED ORDER — IOPAMIDOL (ISOVUE-300) INJECTION 61%
INTRAVENOUS | Status: AC
  Administered 2017-12-12: 100 mL
  Filled 2017-12-12: qty 100

## 2017-12-12 MED ORDER — ENOXAPARIN SODIUM 40 MG/0.4ML ~~LOC~~ SOLN
40.0000 mg | SUBCUTANEOUS | Status: DC
  Administered 2017-12-12 – 2017-12-15 (×4): 40 mg via SUBCUTANEOUS
  Filled 2017-12-12 (×4): qty 0.4

## 2017-12-12 MED ORDER — ACETAMINOPHEN 325 MG PO TABS
650.0000 mg | ORAL_TABLET | Freq: Four times a day (QID) | ORAL | Status: DC | PRN
  Administered 2017-12-14: 650 mg via ORAL
  Filled 2017-12-12: qty 2

## 2017-12-12 MED ORDER — DIPHENHYDRAMINE HCL 25 MG PO CAPS: 25.0000 mg | ORAL_CAPSULE | Freq: Four times a day (QID) | ORAL | Status: DC | PRN

## 2017-12-12 MED ORDER — ONDANSETRON HCL 4 MG/2ML IJ SOLN
4.0000 mg | Freq: Four times a day (QID) | INTRAMUSCULAR | Status: DC | PRN
  Administered 2017-12-12 – 2017-12-13 (×3): 4 mg via INTRAVENOUS
  Filled 2017-12-12 (×3): qty 2

## 2017-12-12 MED ORDER — PANTOPRAZOLE SODIUM 40 MG IV SOLR
40.0000 mg | Freq: Once | INTRAVENOUS | Status: AC
  Administered 2017-12-12: 40 mg via INTRAVENOUS
  Filled 2017-12-12: qty 40

## 2017-12-12 MED ORDER — PANTOPRAZOLE SODIUM 40 MG IV SOLR
40.0000 mg | Freq: Every day | INTRAVENOUS | Status: DC
  Administered 2017-12-12 – 2017-12-14 (×3): 40 mg via INTRAVENOUS
  Filled 2017-12-12 (×3): qty 40

## 2017-12-12 MED ORDER — DEXTROSE-NACL 5-0.45 % IV SOLN
INTRAVENOUS | Status: DC
  Administered 2017-12-12 – 2017-12-15 (×6): via INTRAVENOUS

## 2017-12-12 MED ORDER — HYDROMORPHONE HCL 1 MG/ML IJ SOLN
0.5000 mg | INTRAMUSCULAR | Status: DC | PRN
  Administered 2017-12-12 – 2017-12-14 (×2): 1 mg via INTRAVENOUS
  Filled 2017-12-12 (×2): qty 1

## 2017-12-12 MED ORDER — DIPHENHYDRAMINE HCL 50 MG/ML IJ SOLN: 25.0000 mg | Freq: Four times a day (QID) | INTRAMUSCULAR | Status: DC | PRN

## 2017-12-12 MED ORDER — ONDANSETRON 4 MG PO TBDP: 4.0000 mg | ORAL_TABLET | Freq: Four times a day (QID) | ORAL | Status: DC | PRN

## 2017-12-12 MED ORDER — SODIUM CHLORIDE 0.9 % IV BOLUS (SEPSIS)
500.0000 mL | Freq: Once | INTRAVENOUS | Status: AC
  Administered 2017-12-12: 500 mL via INTRAVENOUS

## 2017-12-12 MED ORDER — HYDRALAZINE HCL 20 MG/ML IJ SOLN: 10.0000 mg | INTRAMUSCULAR | Status: DC | PRN

## 2017-12-12 MED ORDER — DIATRIZOATE MEGLUMINE & SODIUM 66-10 % PO SOLN
90.0000 mL | Freq: Once | ORAL | Status: AC
  Administered 2017-12-13: 90 mL via NASOGASTRIC
  Filled 2017-12-12: qty 90

## 2017-12-12 MED ORDER — OXYCODONE HCL 5 MG PO TABS: 5.0000 mg | ORAL_TABLET | ORAL | Status: DC | PRN

## 2017-12-12 MED ORDER — ACETAMINOPHEN 650 MG RE SUPP: 650.0000 mg | Freq: Four times a day (QID) | RECTAL | Status: DC | PRN

## 2017-12-12 NOTE — ED Notes (Signed)
Attempted IV x 2 unsuccessul.

## 2017-12-12 NOTE — H&P (Signed)
[facility] Admission Note  Nancy [name] [birth year]  [id number].    Requesting MD: Milton Ferguson Chief Complaint/Reason for Consult: SBO  HPI:  Nancy [name] is a 57yo female PMH gastric adenocarcinoma s/p subtotal gastrectomy with Bilroth II anastomosis 01/2010 Dr. Hulen Skains and chemotherapy, who presented to [facility] earlier today with 2 days of worsening abdominal pain, nausea, and vomiting. States that the pain was global about her abdomen, no more central and upper abdomen. Worse with PO intake. Last BM was 2 days ago, and she typically has 1-2 BMs every day. She is not passing any flatus. Denies fever, chills, dysuria.  In the ED her CT scan showed small bowel obstruction, probably secondary to adhesion just deep to the umbilicus. WBC 7.3, VSS. General surgery asked to see.  ROS: Review of Systems  Constitutional: Negative.   HENT: Negative.   Eyes: Negative.   Respiratory: Negative.   Cardiovascular: Negative.   Gastrointestinal: Positive for abdominal pain, constipation, nausea and vomiting. Negative for diarrhea and heartburn.  Genitourinary: Negative.   Musculoskeletal: Negative.   Skin: Negative.   Neurological: Negative.    All systems reviewed and otherwise negative except for as above  Family History  Problem Relation Age of Onset  . Colon cancer Neg Hx   . Stomach cancer Neg Hx     Past Medical History:  Diagnosis Date  . Dermoid cyst of ovary   . Fibroids   . Hypertension   . Malignant neoplasm of stomach, unspecified site 11/11/2011  . 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 BILROTH II PROCEDURE  02/05/10   adenocarcinoma of antrum, Dr. Hulen Skains  . COLONOSCOPY  06/10/2011   Normal  . ESOPHAGOGASTRODUODENOSCOPY  2011   Adenocarcinoma, stomach. Dr. Penelope Coop  . PORT-A-CATH REMOVAL  11/04/2011   Procedure: MINOR REMOVAL PORT-A-CATH;  Surgeon: Belva Crome, MD;  Location: [facility];  Service: General;  Laterality: Left;  . PORTACATH PLACEMENT  03/01/2010   Dr. Hulen Skains    Social History:  reports that  has never smoked. she has never used smokeless tobacco. She reports that she drinks alcohol. She reports that she does not use drugs.  Allergies:  Allergies  Allergen Reactions  . Niacin And Related Other (See Comments)    flushing     (Not in a hospital admission)  Prior to Admission medications   Medication Sig Start Date End Date Taking? Authorizing Provider  IRON PO Take 1 tablet by mouth 2 (two) times daily.   Yes [provider]  naproxen (NAPROSYN) 500 MG tablet Take 1 tablet (500 mg total) by mouth 2 (two) times daily. 07/09/13  Yes Dammen, Collier Salina, PA-C  diphenhydrAMINE (BENADRYL) 25 MG tablet Take 1 tablet (25 mg total) by mouth every 6 (six) hours. Patient not taking: Reported on 12/12/2017 10/01/17   Horton, Barbette Hair, MD  famotidine (PEPCID) 20 MG tablet Take 1 tablet (20 mg total) by mouth 2 (two) times daily. Patient not taking: Reported on 12/12/2017 10/01/17   Horton, Barbette Hair, MD  pantoprazole (PROTONIX) 40 MG tablet Take 1 tablet (40 mg total) by mouth 2 (two) times daily. Patient not taking: Reported on 12/12/2017 08/22/12   Linton Flemings, MD  predniSONE (DELTASONE) 20 MG tablet Take 2 tablets (40 mg total) by mouth daily. Patient not taking: Reported on 12/12/2017 10/01/17   Horton, Barbette Hair, MD    Blood  pressure 128/77, pulse 66, temperature 98.7 F (37.1 C), temperature source Oral, resp. rate 16, height '5\' 8"'$  (1.727 m), weight 225 lb (102.1 kg), last menstrual period 02/26/2012, SpO2 99 %. Physical Exam: General: pleasant, WD/WN AA female who is laying in bed in NAD HEENT: head is normocephalic, atraumatic.  Sclera are noninjected.  Pupils equal and round.  Ears and nose without any masses or lesions.  Mouth is pink and moist. Dentition fair Heart: regular, rate, and rhythm.  No obvious  murmurs, gallops, or rubs noted.  Palpable pedal pulses bilaterally Lungs: CTAB, no wheezes, rhonchi, or rales noted.  Respiratory effort nonlabored Abd: well healed midline incision, soft, mild distension, +BS, no masses, hernias, or organomegaly. Mild global tenderness without rebound or guarding MS: all 4 extremities are symmetrical with no cyanosis, clubbing, or edema. Skin: warm and dry with no masses, lesions, or rashes Psych: A&Ox3 with an appropriate affect. Neuro: cranial nerves grossly intact, extremity CSM intact bilaterally, normal speech  Results for orders placed or performed during the hospital encounter of 12/12/17 (from the past 48 hour(s))  Lipase, blood     Status: None   Collection Time: 12/12/17  5:50 AM  Result Value Ref Range   Lipase 25 11 - 51 U/L    Comment: Performed at [facility], [place] [address], [place], [place] [address]  Comprehensive metabolic panel     Status: Abnormal   Collection Time: 12/12/17  5:50 AM  Result Value Ref Range   Sodium 134 (L) 135 - 145 mmol/L   Potassium 5.1 3.5 - 5.1 mmol/L    Comment: HEMOLYSIS AT THIS LEVEL MAY AFFECT RESULT   Chloride 100 (L) 101 - 111 mmol/L   CO2 23 22 - 32 mmol/L   Glucose, Bld 146 (H) 65 - 99 mg/dL   BUN 20 6 - 20 mg/dL   Creatinine, Ser 0.93 0.44 - 1.00 mg/dL   Calcium 9.9 8.9 - 10.3 mg/dL   Total Protein 7.7 6.5 - 8.1 g/dL   Albumin 4.2 3.5 - 5.0 g/dL   AST 29 15 - 41 U/L   ALT 17 14 - 54 U/L   Alkaline Phosphatase 110 38 - 126 U/L   Total Bilirubin 1.0 0.3 - 1.2 mg/dL   GFR calc non Af Amer >60 >60 mL/min   GFR calc Af Amer >60 >60 mL/min    Comment: (NOTE) The eGFR has been calculated using the CKD EPI equation. This calculation has not been validated in all clinical situations. eGFR's persistently <60 mL/min signify possible Chronic Kidney Disease.    Anion gap 11 5 - 15    Comment: Performed at [facility] [address] [place], [place] [address]  CBC     Status: Abnormal    Collection Time: 12/12/17  5:50 AM  Result Value Ref Range   WBC 7.3 4.0 - 10.5 K/uL   RBC 4.91 3.87 - 5.11 MIL/uL   Hemoglobin 12.4 12.0 - 15.0 g/dL   HCT 39.3 36.0 - 46.0 %   MCV 80.0 78.0 - 100.0 fL   MCH 25.3 (L) 26.0 - 34.0 pg   MCHC 31.6 30.0 - 36.0 g/dL   RDW 13.8 11.5 - 15.5 %   Platelets 272 150 - 400 K/uL    Comment: Performed at [facility], [place] [address], [place], [place] [address]  Urinalysis, Routine w reflex microscopic     Status: Abnormal   Collection Time: 12/12/17  5:55 AM  Result  Value Ref Range   Color, Urine YELLOW YELLOW   APPearance CLEAR CLEAR   Specific Gravity, Urine 1.031 (H) 1.005 - 1.030   pH 7.0 5.0 - 8.0   Glucose, UA NEGATIVE NEGATIVE mg/dL   Hgb urine dipstick NEGATIVE NEGATIVE   Bilirubin Urine NEGATIVE NEGATIVE   Ketones, ur NEGATIVE NEGATIVE mg/dL   Protein, ur 100 (A) NEGATIVE mg/dL   Nitrite NEGATIVE NEGATIVE   Leukocytes, UA NEGATIVE NEGATIVE   RBC / HPF 0-5 0 - 5 RBC/hpf   WBC, UA 0-5 0 - 5 WBC/hpf   Bacteria, UA NONE SEEN NONE SEEN   Squamous Epithelial / LPF 0-5 (A) NONE SEEN   Mucus PRESENT     Comment: Performed at [facility] [address] [place], [place] [address]   Ct Abdomen Pelvis W Contrast  Result Date: 12/12/2017 CLINICAL DATA:  Abdominal pain and distention with nausea and vomiting. EXAM: CT ABDOMEN AND PELVIS WITH CONTRAST TECHNIQUE: Multidetector CT imaging of the abdomen and pelvis was performed using the standard protocol following bolus administration of intravenous contrast. CONTRAST:  167m ISOVUE-300 IOPAMIDOL (ISOVUE-300) INJECTION 61% COMPARISON:  CT scans dated 12/21/2012 and 11/11/2011 FINDINGS: Lower chest: Mild cardiomegaly.  Lung bases are clear. Hepatobiliary: No focal liver abnormality is seen. No gallstones, gallbladder wall thickening, or biliary dilatation. Pancreas: Unremarkable. No pancreatic ductal dilatation or surrounding inflammatory changes. Spleen: Normal in size without focal  abnormality. Adrenals/Urinary Tract: Adrenal glands are unremarkable. Kidneys are normal, without renal calculi, focal lesion, or hydronephrosis. Bladder is unremarkable. Stomach/Bowel: There is a small bowel obstruction. The point of obstruction appears to be in the midline just deep to the umbilicus. Small bowel distal to that site are decompressed. There is a small umbilical hernia containing fat. There are small midline hernias in the linea alba above the umbilicus containing only fat. Evidence of previous gastric bypass surgery. Vascular/Lymphatic: No significant vascular findings are present. No enlarged abdominal or pelvic lymph nodes. Reproductive: There is a 5.4 x 5.9 cm right ovarian dermoid, essentially unchanged. Uterus appears normal. Left ovary appears normal. Other: Minimal amount of free fluid in the pelvis. No free air in the abdomen. Musculoskeletal: No acute or significant osseous findings. IMPRESSION: 1. Small bowel obstruction, probably secondary to adhesion just deep to the umbilicus. 2. 5.9 cm right ovarian dermoid, essentially unchanged since 11/11/2011 Electronically Signed   By: JLorriane ShireM.D.   On: 12/12/2017 10:19      Assessment/Plan H/o gastric adenocarcinoma s/p subtotal gastrectomy with Bilroth II anastomosis 01/2010 Dr. WHulen Skains and chemotherapy  SBO - CT scan shows small bowel obstruction, probably secondary to adhesion just deep to the umbilicus - WBC 7.3, VSS  ID - none VTE - SCDs, lovenox FEN - IVF, NPO/NGT Foley - none Follow up - TBD  Plan - Admit to med-surg. NPO, NGT for decompression. Plan to keep NG tube to LIWS today, start small bowel protocol tomorrow. [facility] 12/12/2017, 11:44 AM Pager: [phone]: [phone]-Fri 7:00 am-4:30 pm Sat-Sun 7:00 am-11:30 am

## 2017-12-12 NOTE — ED Notes (Signed)
C/o abd. Pain onset yest after eating some soup, states she drank some tea and her abd. Felt  Bloated. C/o vomiting and states she feels like she needs to have a bowel movement however can't.

## 2017-12-12 NOTE — ED Notes (Signed)
Admitting at bedside 

## 2017-12-12 NOTE — ED Triage Notes (Signed)
pt reports constipation and lower abdominal pain, states she feels as though she needs to have a bowel movement "but nothing comes out." miralax not effective. Reports some vomiting.

## 2017-12-12 NOTE — ED Provider Notes (Signed)
[facility] EMERGENCY DEPARTMENT Provider Note   CSN: [id number] Arrival date & time: 12/12/17  0516     History   Chief Complaint Chief Complaint  Patient presents with  . Constipation    HPI Nancy [name] is a 57 y.o. female.  Patient complains of epigastric abdominal discomfort with vomiting since yesterday   The history is provided by the patient. No language interpreter was used.  Constipation   This is a new problem. The current episode started 12 to 24 hours ago. The stool is described as formed. Associated symptoms include abdominal pain. There is no fiber in the patient's diet. She does not exercise regularly. There has not been adequate water intake. She has tried nothing for the symptoms. The treatment provided no relief. Her past medical history does not include endocrine disease.    Past Medical History:  Diagnosis Date  . Dermoid cyst of ovary   . Fibroids   . Hypertension   . Malignant neoplasm of stomach, unspecified site 11/11/2011  . 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 SBO (small bowel obstruction) ([place]) 12/12/2017  . Malignant neoplasm of stomach, unspecified site 11/11/2011  . Painful Port-a-cath 10/22/2011  . Anemia 05/20/2011  . Dermoid cyst of ovary 04/13/2011  . Fibroids 04/13/2011  . History of Adenocarcinoma of stomach 04/13/2011    Class: History of    Past Surgical History:  Procedure Laterality Date  . BILROTH II PROCEDURE  02/05/10   adenocarcinoma of antrum, Dr. Hulen Skains  . COLONOSCOPY  06/10/2011   Normal  . ESOPHAGOGASTRODUODENOSCOPY  2011   Adenocarcinoma, stomach. Dr. Penelope Coop  . PORT-A-CATH REMOVAL  11/04/2011   Procedure: MINOR REMOVAL PORT-A-CATH;  Surgeon: Belva Crome, MD;  Location: [facility];  Service: General;  Laterality: Left;  . PORTACATH PLACEMENT  03/01/2010   Dr. Hulen Skains     OB History    No data available       Home Medications    Prior to Admission medications   Medication Sig Start Date End Date Taking? Authorizing Provider  IRON PO Take 1 tablet by mouth 2 (two) times daily.   Yes [provider]  naproxen (NAPROSYN) 500 MG tablet Take 1 tablet (500 mg total) by mouth 2 (two) times daily. 07/09/13  Yes Dammen, Collier Salina, PA-C  diphenhydrAMINE (BENADRYL) 25 MG tablet Take 1 tablet (25 mg total) by mouth every 6 (six) hours. Patient not taking: Reported on 12/12/2017 10/01/17   Horton, Barbette Hair, MD  famotidine (PEPCID) 20 MG tablet Take 1 tablet (20 mg total) by mouth 2 (two) times daily. Patient not taking: Reported on 12/12/2017 10/01/17   Horton, Barbette Hair, MD  pantoprazole (PROTONIX) 40 MG tablet Take 1 tablet (40 mg total) by mouth 2 (two) times daily. Patient not taking: Reported on 12/12/2017 08/22/12   Linton Flemings, MD  predniSONE (DELTASONE) 20 MG tablet Take 2 tablets (40 mg total) by mouth daily. Patient not taking: Reported on 12/12/2017 10/01/17   Horton, Barbette Hair, MD    Family History Family History  Problem Relation Age of Onset  . Colon cancer Neg Hx   . Stomach cancer Neg Hx     Social History Social History   Tobacco Use  . Smoking status: Never Smoker  . Smokeless tobacco: Never Used  Substance Use Topics  . Alcohol use: Yes  Comment: occasionally  . Drug use: No     Allergies   Niacin and related   Review of Systems Review of Systems  Constitutional: Negative for appetite change and fatigue.  HENT: Negative for congestion, ear discharge and sinus pressure.   Eyes: Negative for discharge.  Respiratory: Negative for cough.   Cardiovascular: Negative for chest pain.  Gastrointestinal: Positive for abdominal pain and constipation. Negative for diarrhea.  Genitourinary: Negative for frequency and hematuria.  Musculoskeletal: Negative for back pain.  Skin: Negative for rash.  Neurological: Negative for  seizures and headaches.  Psychiatric/Behavioral: Negative for hallucinations.     Physical Exam Updated Vital Signs BP 134/79   Pulse (!) 56   Temp 98.7 F (37.1 C) (Oral)   Resp 16   Ht 5\' 8"  (1.727 m)   Wt 102.1 kg (225 lb)   LMP 02/26/2012   SpO2 98%   BMI 34.21 kg/m   Physical Exam  Constitutional: She is oriented to person, place, and time. She appears well-developed.  HENT:  Head: Normocephalic.  Eyes: Conjunctivae and EOM are normal. No scleral icterus.  Neck: Neck supple. No thyromegaly present.  Cardiovascular: Normal rate and regular rhythm. Exam reveals no gallop and no friction rub.  No murmur heard. Pulmonary/Chest: No stridor. She has no wheezes. She has no rales. She exhibits no tenderness.  Abdominal: She exhibits no distension. There is tenderness. There is no rebound.  Musculoskeletal: Normal range of motion. She exhibits no edema.  Lymphadenopathy:    She has no cervical adenopathy.  Neurological: She is oriented to person, place, and time. She exhibits normal muscle tone. Coordination normal.  Skin: No rash noted. No erythema.  Psychiatric: She has a normal mood and affect. Her behavior is normal.     ED Treatments / Results  Labs (all labs ordered are listed, but only abnormal results are displayed) Labs Reviewed  COMPREHENSIVE METABOLIC PANEL - Abnormal; Notable for the following components:      Result Value   Sodium 134 (*)    Chloride 100 (*)    Glucose, Bld 146 (*)    All other components within normal limits  CBC - Abnormal; Notable for the following components:   MCH 25.3 (*)    All other components within normal limits  URINALYSIS, ROUTINE W REFLEX MICROSCOPIC - Abnormal; Notable for the following components:   Specific Gravity, Urine 1.031 (*)    Protein, ur 100 (*)    Squamous Epithelial / LPF 0-5 (*)    All other components within normal limits  LIPASE, BLOOD  HIV ANTIBODY (ROUTINE TESTING)  CBC  CREATININE, SERUM    EKG   EKG Interpretation None       Radiology Ct Abdomen Pelvis W Contrast  Result Date: 12/12/2017 CLINICAL DATA:  Abdominal pain and distention with nausea and vomiting. EXAM: CT ABDOMEN AND PELVIS WITH CONTRAST TECHNIQUE: Multidetector CT imaging of the abdomen and pelvis was performed using the standard protocol following bolus administration of intravenous contrast. CONTRAST:  170mL ISOVUE-300 IOPAMIDOL (ISOVUE-300) INJECTION 61% COMPARISON:  CT scans dated 12/21/2012 and 11/11/2011 FINDINGS: Lower chest: Mild cardiomegaly.  Lung bases are clear. Hepatobiliary: No focal liver abnormality is seen. No gallstones, gallbladder wall thickening, or biliary dilatation. Pancreas: Unremarkable. No pancreatic ductal dilatation or surrounding inflammatory changes. Spleen: Normal in size without focal abnormality. Adrenals/Urinary Tract: Adrenal glands are unremarkable. Kidneys are normal, without renal calculi, focal lesion, or hydronephrosis. Bladder is unremarkable. Stomach/Bowel: There is a small bowel obstruction. The point  of obstruction appears to be in the midline just deep to the umbilicus. Small bowel distal to that site are decompressed. There is a small umbilical hernia containing fat. There are small midline hernias in the linea alba above the umbilicus containing only fat. Evidence of previous gastric bypass surgery. Vascular/Lymphatic: No significant vascular findings are present. No enlarged abdominal or pelvic lymph nodes. Reproductive: There is a 5.4 x 5.9 cm right ovarian dermoid, essentially unchanged. Uterus appears normal. Left ovary appears normal. Other: Minimal amount of free fluid in the pelvis. No free air in the abdomen. Musculoskeletal: No acute or significant osseous findings. IMPRESSION: 1. Small bowel obstruction, probably secondary to adhesion just deep to the umbilicus. 2. 5.9 cm right ovarian dermoid, essentially unchanged since 11/11/2011 Electronically Signed   By: Lorriane Shire M.D.    On: 12/12/2017 10:19    Procedures Procedures (including critical care time)  Medications Ordered in ED Medications  enoxaparin (LOVENOX) injection 40 mg (not administered)  dextrose 5 %-0.45 % sodium chloride infusion (not administered)  hydrALAZINE (APRESOLINE) injection 10 mg (not administered)  metoprolol tartrate (LOPRESSOR) injection 5 mg (not administered)  pantoprazole (PROTONIX) injection 40 mg (not administered)  ondansetron (ZOFRAN-ODT) disintegrating tablet 4 mg (not administered)    Or  ondansetron (ZOFRAN) injection 4 mg (not administered)  diphenhydrAMINE (BENADRYL) capsule 25 mg (not administered)    Or  diphenhydrAMINE (BENADRYL) injection 25 mg (not administered)  HYDROmorphone (DILAUDID) injection 0.5-1 mg (not administered)  acetaminophen (TYLENOL) tablet 650 mg (not administered)    Or  acetaminophen (TYLENOL) suppository 650 mg (not administered)  oxyCODONE (Oxy IR/ROXICODONE) immediate release tablet 5-10 mg (not administered)  methocarbamol (ROBAXIN) 500 mg in dextrose 5 % 50 mL IVPB (not administered)  diatrizoate meglumine-sodium (GASTROGRAFIN) 66-10 % solution 90 mL (not administered)  pantoprazole (PROTONIX) injection 40 mg (40 mg Intravenous Given 12/12/17 0902)  sodium chloride 0.9 % bolus 500 mL (0 mLs Intravenous Stopped 12/12/17 1211)  HYDROmorphone (DILAUDID) injection 0.5 mg (0.5 mg Intravenous Given 12/12/17 0902)  iopamidol (ISOVUE-300) 61 % injection (100 mLs  Contrast Given 12/12/17 0917)     Initial Impression / Assessment and Plan / ED Course  I have reviewed the triage vital signs and the nursing notes.  Pertinent labs & imaging results that were available during my care of the patient were reviewed by me and considered in my medical decision making (see chart for details).     CT scan shows small bowel obstruction.  Patient will be admitted to surgery  Final Clinical Impressions(s) / ED Diagnoses   Final diagnoses:  Small bowel  obstruction [facility])    ED Discharge Orders    None       Milton Ferguson, MD 12/12/17 1218

## 2017-12-12 NOTE — ED Notes (Signed)
Attempted report x1. 

## 2017-12-13 ENCOUNTER — Observation Stay (HOSPITAL_COMMUNITY): Payer: Self-pay

## 2017-12-13 LAB — BASIC METABOLIC PANEL
Anion gap: 10 (ref 5–15)
BUN: 14 mg/dL (ref 6–20)
CO2: 23 mmol/L (ref 22–32)
CREATININE: 0.78 mg/dL (ref 0.44–1.00)
Calcium: 9.1 mg/dL (ref 8.9–10.3)
Chloride: 99 mmol/L — ABNORMAL LOW (ref 101–111)
GFR calc Af Amer: >60 mL/min (ref >60)
GLUCOSE: 151 mg/dL — AB (ref 65–99)
POTASSIUM: 4 mmol/L (ref 3.5–5.1)
SODIUM: 132 mmol/L — AB (ref 135–145)

## 2017-12-13 LAB — CBC
HEMATOCRIT: 39.3 % (ref 36.0–46.0)
Hemoglobin: 12.3 g/dL (ref 12.0–15.0)
MCH: 25 pg — ABNORMAL LOW (ref 26.0–34.0)
MCHC: 31.3 g/dL (ref 30.0–36.0)
MCV: 79.9 fL (ref 78.0–100.0)
PLATELETS: 242 10*3/uL (ref 150–400)
RBC: 4.92 MIL/uL (ref 3.87–5.11)
RDW: 13.5 % (ref 11.5–15.5)
WBC: 4.7 10*3/uL (ref 4.0–10.5)

## 2017-12-13 LAB — HIV ANTIBODY (ROUTINE TESTING W REFLEX): HIV Screen 4th Generation wRfx: NONREACTIVE

## 2017-12-13 MED ORDER — PHENOL 1.4 % MT LIQD
1.0000 | OROMUCOSAL | Status: DC | PRN
  Administered 2017-12-13: 1 via OROMUCOSAL
  Filled 2017-12-13: qty 177

## 2017-12-13 NOTE — Progress Notes (Signed)
[facility] Progress Note     Subjective: CC- abdominal pain Patient vomited this morning about 30 minutes after receiving gastrograffin. States that her abdomen is cramping. No BM or flatus. NG tube back to suction. She was unable to void this morning. Bladder scan showed 650cc urine.   Objective: Vital signs in last 24 hours: Temp:  [97.7 F (36.5 C)-98.8 F (37.1 C)] 98.7 F (37.1 C) (03/02 0503) Pulse Rate:  [56-82] 68 (03/02 0503) Resp:  [12-16] 16 (03/02 0503) BP: (117-157)/(59-83) 131/70 (03/02 0503) SpO2:  [96 %-100 %] 97 % (03/02 0503) Weight:  [210 lb 5.1 oz (95.4 kg)] 210 lb 5.1 oz (95.4 kg) (03/01 1318) Last BM Date: 12/10/17  Intake/Output from previous day: 03/01 0701 - 03/02 0700 In: 2757.1 [I.V.:2177.1; IV Piggyback:580] Out: 240 [Emesis/NG output:240] Intake/Output this shift: No intake/output data recorded.  PE: Gen:  Alert, NAD, pleasant HEENT: EOM's intact, pupils equal and round Card:  RRR, no M/G/R heard Pulm:  CTAB, no W/R/R, effort normal Abd: Soft, distended, +BS, nontender Psych: A&Ox3  Skin: no rashes noted, warm and dry  Lab Results:  Recent Labs    12/12/17 0550 12/13/17 0334  WBC 7.3 4.7  HGB 12.4 12.3  HCT 39.3 39.3  PLT 272 242   BMET Recent Labs    12/12/17 0550 12/13/17 0334  NA 134* 132*  K 5.1 4.0  CL 100* 99*  CO2 23 23  GLUCOSE 146* 151*  BUN 20 14  CREATININE 0.93 0.78  CALCIUM 9.9 9.1   PT/INR No results for input(s): LABPROT, INR in the last 72 hours. CMP     Component Value Date/Time   NA 132 (L) 12/13/2017 0334   NA 139 11/23/2012 1009   K 4.0 12/13/2017 0334   K 4.3 11/23/2012 1009   CL 99 (L) 12/13/2017 0334   CL 103 11/23/2012 1009   CO2 23 12/13/2017 0334   CO2 28 11/23/2012 1009   GLUCOSE 151 (H) 12/13/2017 0334   GLUCOSE 87 11/23/2012 1009   BUN 14 12/13/2017 0334   BUN 18.6 11/23/2012 1009   CREATININE 0.78 12/13/2017 0334   CREATININE 1.0 11/23/2012 1009   CALCIUM 9.1  12/13/2017 0334   CALCIUM 9.8 11/23/2012 1009   PROT 7.7 12/12/2017 0550   PROT 7.9 11/23/2012 1009   ALBUMIN 4.2 12/12/2017 0550   ALBUMIN 3.8 11/23/2012 1009   AST 29 12/12/2017 0550   AST 15 11/23/2012 1009   ALT 17 12/12/2017 0550   ALT 14 11/23/2012 1009   ALKPHOS 110 12/12/2017 0550   ALKPHOS 105 11/23/2012 1009   BILITOT 1.0 12/12/2017 0550   BILITOT 0.48 11/23/2012 1009   GFRNONAA >60 12/13/2017 0334   GFRAA >60 12/13/2017 0334   Lipase     Component Value Date/Time   LIPASE 25 12/12/2017 0550       Studies/Results: Ct Abdomen Pelvis W Contrast  Result Date: 12/12/2017 CLINICAL DATA:  Abdominal pain and distention with nausea and vomiting. EXAM: CT ABDOMEN AND PELVIS WITH CONTRAST TECHNIQUE: Multidetector CT imaging of the abdomen and pelvis was performed using the standard protocol following bolus administration of intravenous contrast. CONTRAST:  160mL ISOVUE-300 IOPAMIDOL (ISOVUE-300) INJECTION 61% COMPARISON:  CT scans dated 12/21/2012 and 11/11/2011 FINDINGS: Lower chest: Mild cardiomegaly.  Lung bases are clear. Hepatobiliary: No focal liver abnormality is seen. No gallstones, gallbladder wall thickening, or biliary dilatation. Pancreas: Unremarkable. No pancreatic ductal dilatation or surrounding inflammatory changes. Spleen: Normal in size without focal abnormality. Adrenals/Urinary Tract: Adrenal  glands are unremarkable. Kidneys are normal, without renal calculi, focal lesion, or hydronephrosis. Bladder is unremarkable. Stomach/Bowel: There is a small bowel obstruction. The point of obstruction appears to be in the midline just deep to the umbilicus. Small bowel distal to that site are decompressed. There is a small umbilical hernia containing fat. There are small midline hernias in the linea alba above the umbilicus containing only fat. Evidence of previous gastric bypass surgery. Vascular/Lymphatic: No significant vascular findings are present. No enlarged abdominal or  pelvic lymph nodes. Reproductive: There is a 5.4 x 5.9 cm right ovarian dermoid, essentially unchanged. Uterus appears normal. Left ovary appears normal. Other: Minimal amount of free fluid in the pelvis. No free air in the abdomen. Musculoskeletal: No acute or significant osseous findings. IMPRESSION: 1. Small bowel obstruction, probably secondary to adhesion just deep to the umbilicus. 2. 5.9 cm right ovarian dermoid, essentially unchanged since 11/11/2011 Electronically Signed   By: Lorriane Shire M.D.   On: 12/12/2017 10:19   Dg Abd Portable 1v-small Bowel Protocol-position Verification  Result Date: 12/12/2017 CLINICAL DATA:  Nasogastric tube placement EXAM: PORTABLE ABDOMEN - 1 VIEW COMPARISON:  Portable exam 1416 hours compared to CT abdomen and pelvis 12/12/2017 FINDINGS: Nasogastric tube coiled in proximal stomach. Scattered stool in colon. Dilated small bowel loop in the LEFT mid abdomen consistent with small bowel obstruction as noted on prior CT. Bones unremarkable. Small amount of excreted contrast material within RIGHT renal collecting system. IMPRESSION: Nasogastric tube at proximal stomach. Electronically Signed   By: Lavonia Dana M.D.   On: 12/12/2017 14:51    Anti-infectives: Anti-infectives (From admission, onward)   None       Assessment/Plan Urinary retention - bladder scan 650cc, I&O cath x1 this morning. If still unable to void place foley  SBO - H/o gastric adenocarcinoma s/p subtotal gastrectomy with Bilroth II anastomosis 01/2010 Dr. Hulen Skains, and chemotherapy - CT scan shows small bowel obstruction, probably secondary to adhesion just deep to the umbilicus - WBC 4.7, VSS  ID - none VTE - SCDs, lovenox FEN - IVF, NPO/NGT Foley - none Follow up - TBD  Plan - Start small bowel protocol today with delayed film this afternoon. Ok to clamp NG tube to allow patient to ambulate today. If patient unable to void place foley. Labs in AM.   LOS: 0 days    Wellington Hampshire ,  [facility] 12/13/2017, 8:00 AM Pager: [phone] Consults: [phone] Mon-Fri 7:00 am-4:30 pm Sat-Sun 7:00 am-11:30 am

## 2017-12-13 NOTE — Progress Notes (Addendum)
Administered gastrografin at [phone] with NGT clamped around 0640. However, during shift report, patient vomited green/brown liquid around 0710 with NGT still clamped.  Also, bladder scan showed 650 cc at 0658. Appropriately endorsed to day shift RN.  Text paged on-call MD and charge RN also informed on site PA.

## 2017-12-14 ENCOUNTER — Inpatient Hospital Stay (HOSPITAL_COMMUNITY): Payer: Self-pay

## 2017-12-14 LAB — BASIC METABOLIC PANEL
Anion gap: 9 (ref 5–15)
BUN: 9 mg/dL (ref 6–20)
CALCIUM: 8.8 mg/dL — AB (ref 8.9–10.3)
CO2: 22 mmol/L (ref 22–32)
Chloride: 104 mmol/L (ref 101–111)
Creatinine, Ser: 0.81 mg/dL (ref 0.44–1.00)
GFR calc Af Amer: >60 mL/min (ref >60)
GFR calc non Af Amer: >60 mL/min (ref >60)
GLUCOSE: 130 mg/dL — AB (ref 65–99)
Potassium: 4.3 mmol/L (ref 3.5–5.1)
Sodium: 135 mmol/L (ref 135–145)

## 2017-12-14 LAB — CBC
HEMATOCRIT: 35.5 % — AB (ref 36.0–46.0)
Hemoglobin: 11 g/dL — ABNORMAL LOW (ref 12.0–15.0)
MCH: 24.9 pg — ABNORMAL LOW (ref 26.0–34.0)
MCHC: 31 g/dL (ref 30.0–36.0)
MCV: 80.5 fL (ref 78.0–100.0)
Platelets: 212 10*3/uL (ref 150–400)
RBC: 4.41 MIL/uL (ref 3.87–5.11)
RDW: 13.5 % (ref 11.5–15.5)
WBC: 4 10*3/uL (ref 4.0–10.5)

## 2017-12-14 NOTE — Progress Notes (Signed)
[facility] Progress Note     Subjective: CC- sore Patient states that her abdomen is feeling better today. Bloating resolved. She only reports mild soreness. Passing flatus, no BM. XR today shows Nonobstructive bowel gas pattern, contrast extends from the hepatic flexure to the rectosigmoid colon.  Objective: Vital signs in last 24 hours: Temp:  [98.1 F (36.7 C)-99 F (37.2 C)] 98.1 F (36.7 C) (03/03 0451) Pulse Rate:  [64-70] 70 (03/03 0451) Resp:  [15-17] 17 (03/03 0451) BP: (122-148)/(64-79) 122/64 (03/03 0451) SpO2:  [95 %-98 %] 97 % (03/03 0451) Last BM Date: 12/10/17  Intake/Output from previous day: 03/02 0701 - 03/03 0700 In: 2000 [I.V.:2000] Out: 3025 [Urine:1600; Emesis/NG output:1425] Intake/Output this shift: No intake/output data recorded.  PE: Gen:  Alert, NAD, pleasant HEENT: EOM's intact, pupils equal and round Card:  RRR, no M/G/R heard Pulm:  CTAB, no W/R/R, effort normal Abd: Soft, nondistended, +BS in all 4 quadrants, nontender Psych: A&Ox3  Skin: no rashes noted, warm and dry  Lab Results:  Recent Labs    12/13/17 0334 12/14/17 0600  WBC 4.7 4.0  HGB 12.3 11.0*  HCT 39.3 35.5*  PLT 242 212   BMET Recent Labs    12/13/17 0334 12/14/17 0600  NA 132* 135  K 4.0 4.3  CL 99* 104  CO2 23 22  GLUCOSE 151* 130*  BUN 14 9  CREATININE 0.78 0.81  CALCIUM 9.1 8.8*   PT/INR No results for input(s): LABPROT, INR in the last 72 hours. CMP     Component Value Date/Time   NA 135 12/14/2017 0600   NA 139 11/23/2012 1009   K 4.3 12/14/2017 0600   K 4.3 11/23/2012 1009   CL 104 12/14/2017 0600   CL 103 11/23/2012 1009   CO2 22 12/14/2017 0600   CO2 28 11/23/2012 1009   GLUCOSE 130 (H) 12/14/2017 0600   GLUCOSE 87 11/23/2012 1009   BUN 9 12/14/2017 0600   BUN 18.6 11/23/2012 1009   CREATININE 0.81 12/14/2017 0600   CREATININE 1.0 11/23/2012 1009   CALCIUM 8.8 (L) 12/14/2017 0600   CALCIUM 9.8 11/23/2012 1009   PROT  7.7 12/12/2017 0550   PROT 7.9 11/23/2012 1009   ALBUMIN 4.2 12/12/2017 0550   ALBUMIN 3.8 11/23/2012 1009   AST 29 12/12/2017 0550   AST 15 11/23/2012 1009   ALT 17 12/12/2017 0550   ALT 14 11/23/2012 1009   ALKPHOS 110 12/12/2017 0550   ALKPHOS 105 11/23/2012 1009   BILITOT 1.0 12/12/2017 0550   BILITOT 0.48 11/23/2012 1009   GFRNONAA >60 12/14/2017 0600   GFRAA >60 12/14/2017 0600   Lipase     Component Value Date/Time   LIPASE 25 12/12/2017 0550       Studies/Results: Ct Abdomen Pelvis W Contrast  Result Date: 12/12/2017 CLINICAL DATA:  Abdominal pain and distention with nausea and vomiting. EXAM: CT ABDOMEN AND PELVIS WITH CONTRAST TECHNIQUE: Multidetector CT imaging of the abdomen and pelvis was performed using the standard protocol following bolus administration of intravenous contrast. CONTRAST:  129mL ISOVUE-300 IOPAMIDOL (ISOVUE-300) INJECTION 61% COMPARISON:  CT scans dated 12/21/2012 and 11/11/2011 FINDINGS: Lower chest: Mild cardiomegaly.  Lung bases are clear. Hepatobiliary: No focal liver abnormality is seen. No gallstones, gallbladder wall thickening, or biliary dilatation. Pancreas: Unremarkable. No pancreatic ductal dilatation or surrounding inflammatory changes. Spleen: Normal in size without focal abnormality. Adrenals/Urinary Tract: Adrenal glands are unremarkable. Kidneys are normal, without renal calculi, focal lesion, or hydronephrosis. Bladder is unremarkable.  Stomach/Bowel: There is a small bowel obstruction. The point of obstruction appears to be in the midline just deep to the umbilicus. Small bowel distal to that site are decompressed. There is a small umbilical hernia containing fat. There are small midline hernias in the linea alba above the umbilicus containing only fat. Evidence of previous gastric bypass surgery. Vascular/Lymphatic: No significant vascular findings are present. No enlarged abdominal or pelvic lymph nodes. Reproductive: There is a 5.4 x 5.9  cm right ovarian dermoid, essentially unchanged. Uterus appears normal. Left ovary appears normal. Other: Minimal amount of free fluid in the pelvis. No free air in the abdomen. Musculoskeletal: No acute or significant osseous findings. IMPRESSION: 1. Small bowel obstruction, probably secondary to adhesion just deep to the umbilicus. 2. 5.9 cm right ovarian dermoid, essentially unchanged since 11/11/2011 Electronically Signed   By: Lorriane Shire M.D.   On: 12/12/2017 10:19   Dg Abd Portable 1v  Result Date: 12/14/2017 CLINICAL DATA:  SBO EXAM: PORTABLE ABDOMEN - 1 VIEW COMPARISON:  12/13/2017 FINDINGS: Nonobstructive bowel gas pattern. Contrast in the left colon, extending from the hepatic flexure to the rectosigmoid region. IMPRESSION: Nonobstructive bowel gas pattern. Contrast extends from the hepatic flexure to the rectosigmoid colon. Electronically Signed   By: Julian Hy M.D.   On: 12/14/2017 07:53   Dg Abd Portable 1v-small Bowel Obstruction Protocol-initial, 8 Hr Delay  Result Date: 12/13/2017 CLINICAL DATA:  Small bowel obstruction. EXAM: PORTABLE ABDOMEN - 1 VIEW COMPARISON:  Radiograph of December 12, 2017. FINDINGS: The bowel gas pattern is normal. Distal tip of nasogastric tube is seen in proximal stomach. Phlebolith is noted in the pelvis. IMPRESSION: Distal tip of nasogastric tube seen in proximal stomach. No abnormal bowel dilatation or obstruction is noted. Electronically Signed   By: Marijo Conception, M.D.   On: 12/13/2017 17:24   Dg Abd Portable 1v-small Bowel Protocol-position Verification  Result Date: 12/12/2017 CLINICAL DATA:  Nasogastric tube placement EXAM: PORTABLE ABDOMEN - 1 VIEW COMPARISON:  Portable exam 1416 hours compared to CT abdomen and pelvis 12/12/2017 FINDINGS: Nasogastric tube coiled in proximal stomach. Scattered stool in colon. Dilated small bowel loop in the LEFT mid abdomen consistent with small bowel obstruction as noted on prior CT. Bones unremarkable. Small  amount of excreted contrast material within RIGHT renal collecting system. IMPRESSION: Nasogastric tube at proximal stomach. Electronically Signed   By: Lavonia Dana M.D.   On: 12/12/2017 14:51    Anti-infectives: Anti-infectives (From admission, onward)   None       Assessment/Plan Urinary retention - resolved  SBO - H/o gastric adenocarcinoma s/psubtotal gastrectomy with Bilroth II anastomosis 01/2010 Dr. Hulen Skains, and chemotherapy - CT scan shows small bowel obstruction, probably secondary to adhesion just deep to the umbilicus - XR today shows Nonobstructive bowel gas pattern, contrast extends from the hepatic flexure to the rectosigmoid colon - passing flatus  ID -none VTE -SCDs, lovenox FEN -decrease IVF 75cc/hr, clear liquids Foley -none Follow up -TBD  Plan - Obstruction appears resolved on xray, contrast passed into colon. D/c NG tube and advance to clear liquids. Encourage ambulation today.   LOS: 1 day    Wellington Hampshire , [facility] 12/14/2017, 8:44 AM Pager: [phone] Consults: [phone] Mon-Fri 7:00 am-4:30 pm Sat-Sun 7:00 am-11:30 am

## 2017-12-15 MED ORDER — PANTOPRAZOLE SODIUM 40 MG PO TBEC
40.0000 mg | DELAYED_RELEASE_TABLET | Freq: Every day | ORAL | Status: DC
  Administered 2017-12-15: 40 mg via ORAL
  Filled 2017-12-15: qty 1

## 2017-12-15 NOTE — Progress Notes (Signed)
Patient ID: Nancy [name], female   DOB: April 21, 1961, 56 y.o.   MRN: [id number]       Subjective: Patient is feeling well today.  She has moved her bowels and is passing flatus.  Abdominal pain is improved and she has no further nausea.  She is tolerating her clear liquids well.  Objective: Vital signs in last 24 hours: Temp:  [97.7 F (36.5 C)-98.3 F (36.8 C)] 97.7 F (36.5 C) (03/04 0525) Pulse Rate:  [60-75] 60 (03/04 0525) Resp:  [16-18] 16 (03/04 0525) BP: (123-129)/(60-75) 126/72 (03/04 0525) SpO2:  [97 %-99 %] 97 % (03/04 0525) Last BM Date: 12/14/17  Intake/Output from previous day: 03/03 0701 - 03/04 0700 In: 3066.7 [P.O.:900; I.V.:2166.7] Out: 800 [Urine:800] Intake/Output this shift: No intake/output data recorded.  PE: Heart: regular, rate, and rhythm Lungs: CTAB Abd: soft, NT, ND, +BS  Lab Results:  Recent Labs    12/13/17 0334 12/14/17 0600  WBC 4.7 4.0  HGB 12.3 11.0*  HCT 39.3 35.5*  PLT 242 212   BMET Recent Labs    12/13/17 0334 12/14/17 0600  NA 132* 135  K 4.0 4.3  CL 99* 104  CO2 23 22  GLUCOSE 151* 130*  BUN 14 9  CREATININE 0.78 0.81  CALCIUM 9.1 8.8*   PT/INR No results for input(s): LABPROT, INR in the last 72 hours. CMP     Component Value Date/Time   NA 135 12/14/2017 0600   NA 139 11/23/2012 1009   K 4.3 12/14/2017 0600   K 4.3 11/23/2012 1009   CL 104 12/14/2017 0600   CL 103 11/23/2012 1009   CO2 22 12/14/2017 0600   CO2 28 11/23/2012 1009   GLUCOSE 130 (H) 12/14/2017 0600   GLUCOSE 87 11/23/2012 1009   BUN 9 12/14/2017 0600   BUN 18.6 11/23/2012 1009   CREATININE 0.81 12/14/2017 0600   CREATININE 1.0 11/23/2012 1009   CALCIUM 8.8 (L) 12/14/2017 0600   CALCIUM 9.8 11/23/2012 1009   PROT 7.7 12/12/2017 0550   PROT 7.9 11/23/2012 1009   ALBUMIN 4.2 12/12/2017 0550   ALBUMIN 3.8 11/23/2012 1009   AST 29 12/12/2017 0550   AST 15 11/23/2012 1009   ALT 17 12/12/2017 0550   ALT 14 11/23/2012 1009   ALKPHOS 110  12/12/2017 0550   ALKPHOS 105 11/23/2012 1009   BILITOT 1.0 12/12/2017 0550   BILITOT 0.48 11/23/2012 1009   GFRNONAA >60 12/14/2017 0600   GFRAA >60 12/14/2017 0600   Lipase     Component Value Date/Time   LIPASE 25 12/12/2017 0550       Studies/Results: Dg Abd Portable 1v  Result Date: 12/14/2017 CLINICAL DATA:  SBO EXAM: PORTABLE ABDOMEN - 1 VIEW COMPARISON:  12/13/2017 FINDINGS: Nonobstructive bowel gas pattern. Contrast in the left colon, extending from the hepatic flexure to the rectosigmoid region. IMPRESSION: Nonobstructive bowel gas pattern. Contrast extends from the hepatic flexure to the rectosigmoid colon. Electronically Signed   By: Julian Hy M.D.   On: 12/14/2017 07:53   Dg Abd Portable 1v-small Bowel Obstruction Protocol-initial, 8 Hr Delay  Result Date: 12/13/2017 CLINICAL DATA:  Small bowel obstruction. EXAM: PORTABLE ABDOMEN - 1 VIEW COMPARISON:  Radiograph of December 12, 2017. FINDINGS: The bowel gas pattern is normal. Distal tip of nasogastric tube is seen in proximal stomach. Phlebolith is noted in the pelvis. IMPRESSION: Distal tip of nasogastric tube seen in proximal stomach. No abnormal bowel dilatation or obstruction is noted. Electronically Signed   By: Jeneen Rinks  Murlean Caller, M.D.   On: 12/13/2017 17:24    Anti-infectives: Anti-infectives (From admission, onward)   None       Assessment/Plan  1. SBO Advance to soft diet today as she is tolerating clear liquids well with good bowel function.  If she tolerates this well, then plan for DC home in am.  She will need a work note per the RN prior to leaving.    LOS: 2 days    Henreitta Cea , [facility] 12/15/2017, 7:56 AM Pager: [phone] Consults: [phone] Mon-Fri 7:00 am-4:30 pm Sat-Sun 7:00 am-11:30 am

## 2017-12-16 NOTE — Discharge Instructions (Signed)
Advance diet as you tolerate.  May start with softer foods and return to your normal diet as able.  You may find that smaller more frequent meals works better for you.  Make sure to chew your food well.

## 2017-12-16 NOTE — Progress Notes (Signed)
Discharge instructions reviewed with Patient and left patient with instructions along with her work excuse .  Pt is being picked up by her husband. No f/u appointments scheduled - PRN with [facility]. Pt to f/u with her PCP. No prescriptions.

## 2017-12-16 NOTE — Discharge Summary (Signed)
Patient ID: Nancy [name] [id number] 11/10/1960 57 y.o.  Admit date: 12/12/2017 Discharge date: 12/16/2017  Admitting Diagnosis: Small bowel obstruction  Discharge Diagnosis Patient Active Problem List   Diagnosis Date Noted  . SBO (small bowel obstruction) ([place]) 12/12/2017  . Malignant neoplasm of stomach, unspecified site 11/11/2011  . Painful Port-a-cath 10/22/2011  . Anemia 05/20/2011  . Dermoid cyst of ovary 04/13/2011  . Fibroids 04/13/2011  . 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 Hulen Skains and chemotherapy, who presented to [facility] earlier today with 2 days of worsening abdominal pain, nausea, and vomiting. States that the pain was global about her abdomen, no more central and upper abdomen. Worse with PO intake. Last BM was 2 days ago, and she typically has 1-2 BMs every day. She is not passing any flatus. Denies fever, chills, dysuria.  In the ED her CT scan showed small bowel obstruction, probably secondary to adhesion just deep to the umbilicus. WBC 7.3, VSS. General surgery asked to see.  Procedures None  Hospital Course:  The patient was admitted and underwent NGT placement with decompression on her initial day of admission.  SBO protocol was started the following day.  On HD 3, the patient was noted to have contrast in her colon on follow up imaging.  Her NGT was removed as she was beginning to have bowel function as well.  She was advanced to clear liquids.  The following day she was advanced to a soft diet.  She tolerated this well and on HD 5, was stable for DC home.  She was continuing to have bowel function, no abdominal pain or distention, and tolerating a soft diet.      Physical Exam: Heat: regular Lungs: CTAB Abd: soft, NT, ND, +BS  Allergies as of 12/16/2017        Reactions   Niacin And Related Other (See Comments)   flushing      Medication List    TAKE these medications   diphenhydrAMINE 25 MG tablet Commonly known as:  BENADRYL Take 1 tablet (25 mg total) by mouth every 6 (six) hours.   famotidine 20 MG tablet Commonly known as:  PEPCID Take 1 tablet (20 mg total) by mouth 2 (two) times daily.   IRON PO Take 1 tablet by mouth 2 (two) times daily.   naproxen 500 MG tablet Commonly known as:  NAPROSYN Take 1 tablet (500 mg total) by mouth 2 (two) times daily.   pantoprazole 40 MG tablet Commonly known as:  PROTONIX Take 1 tablet (40 mg total) by mouth 2 (two) times daily.   predniSONE 20 MG tablet Commonly known as:  DELTASONE Take 2 tablets (40 mg total) by mouth daily.        Follow-up Information    Harvie Junior, MD Follow up.   Specialty:  Family Medicine Why:  call and follow up with medical issues.  Let them know you were in the hospital for the bowel obstruction. Contact information: [place] [address] [phone]        Surgery, [place] Follow up.   Specialty:  General Surgery Why:  you were taken care of by our surgeons here in the hospital.  You do not need to follow up with our service unless you have a  problem. Contact information: [address] [place] [address] [phone]           Signed: Saverio Danker, [facility] Surgery 12/16/2017, 7:52 AM Pager: [phone] Consults: [phone] Mon-Fri 7:00 am-4:30 pm Sat-Sun 7:00 am-11:30 am

## 2018-04-10 ENCOUNTER — Other Ambulatory Visit: Payer: Self-pay

## 2018-04-10 ENCOUNTER — Emergency Department (HOSPITAL_COMMUNITY)
Admission: EM | Admit: 2018-04-10 | Discharge: 2018-04-10 | Disposition: A | Discharge status: Home / Self Care | Payer: Self-pay | Attending: Emergency Medicine | Admitting: Emergency Medicine

## 2018-04-10 ENCOUNTER — Emergency Department (HOSPITAL_COMMUNITY): Payer: Self-pay

## 2018-04-10 ENCOUNTER — Encounter (HOSPITAL_COMMUNITY): Payer: Self-pay

## 2018-04-10 DIAGNOSIS — I10 Essential (primary) hypertension: Secondary | ICD-10-CM | POA: Insufficient documentation

## 2018-04-10 DIAGNOSIS — R0602 Shortness of breath: Secondary | ICD-10-CM | POA: Insufficient documentation

## 2018-04-10 DIAGNOSIS — Z85028 Personal history of other malignant neoplasm of stomach: Secondary | ICD-10-CM | POA: Insufficient documentation

## 2018-04-10 DIAGNOSIS — Z79899 Other long term (current) drug therapy: Secondary | ICD-10-CM | POA: Insufficient documentation

## 2018-04-10 DIAGNOSIS — J209 Acute bronchitis, unspecified: Secondary | ICD-10-CM | POA: Insufficient documentation

## 2018-04-10 DIAGNOSIS — D72829 Elevated white blood cell count, unspecified: Secondary | ICD-10-CM | POA: Insufficient documentation

## 2018-04-10 LAB — CBC WITH DIFFERENTIAL/PLATELET
BASOS ABS: 0 10*3/uL (ref 0.0–0.1)
Basophils Relative: 0 %
Eosinophils Absolute: 0.4 10*3/uL (ref 0.0–0.7)
Eosinophils Relative: 3 %
HCT: 37.2 % (ref 36.0–46.0)
Hemoglobin: 11.3 g/dL — ABNORMAL LOW (ref 12.0–15.0)
LYMPHS ABS: 1.8 10*3/uL (ref 0.7–4.0)
LYMPHS PCT: 14 %
MCH: 25.3 pg — ABNORMAL LOW (ref 26.0–34.0)
MCHC: 30.4 g/dL (ref 30.0–36.0)
MCV: 83.4 fL (ref 78.0–100.0)
MONO ABS: 0.6 10*3/uL (ref 0.1–1.0)
Monocytes Relative: 4 %
NEUTROS ABS: 10.8 10*3/uL — AB (ref 1.7–7.7)
Neutrophils Relative %: 79 %
Platelets: 270 10*3/uL (ref 150–400)
RBC: 4.46 MIL/uL (ref 3.87–5.11)
RDW: 14.1 % (ref 11.5–15.5)
WBC: 13.5 10*3/uL — AB (ref 4.0–10.5)

## 2018-04-10 LAB — BASIC METABOLIC PANEL
ANION GAP: 9 (ref 5–15)
BUN: 13 mg/dL (ref 6–20)
CO2: 26 mmol/L (ref 22–32)
Calcium: 9.4 mg/dL (ref 8.9–10.3)
Chloride: 107 mmol/L (ref 98–111)
Creatinine, Ser: 0.79 mg/dL (ref 0.44–1.00)
Glucose, Bld: 124 mg/dL — ABNORMAL HIGH (ref 70–99)
POTASSIUM: 3.6 mmol/L (ref 3.5–5.1)
SODIUM: 142 mmol/L (ref 135–145)

## 2018-04-10 LAB — TROPONIN I: Troponin I: <0.03 ng/mL (ref <0.03)

## 2018-04-10 MED ORDER — PREDNISONE 20 MG PO TABS: 40.0000 mg | ORAL_TABLET | Freq: Every day | ORAL | 0 refills | Status: DC

## 2018-04-10 MED ORDER — ACETAMINOPHEN 325 MG PO TABS
650.0000 mg | ORAL_TABLET | Freq: Once | ORAL | Status: AC
  Administered 2018-04-10: 650 mg via ORAL
  Filled 2018-04-10: qty 2

## 2018-04-10 MED ORDER — IPRATROPIUM-ALBUTEROL 0.5-2.5 (3) MG/3ML IN SOLN
3.0000 mL | Freq: Once | RESPIRATORY_TRACT | Status: AC
  Administered 2018-04-10: 3 mL via RESPIRATORY_TRACT
  Filled 2018-04-10: qty 3

## 2018-04-10 MED ORDER — SODIUM CHLORIDE 0.9 % IV BOLUS
1000.0000 mL | Freq: Once | INTRAVENOUS | Status: AC
  Administered 2018-04-10: 1000 mL via INTRAVENOUS

## 2018-04-10 MED ORDER — PREDNISONE 20 MG PO TABS
60.0000 mg | ORAL_TABLET | Freq: Once | ORAL | Status: AC
  Administered 2018-04-10: 60 mg via ORAL
  Filled 2018-04-10: qty 3

## 2018-04-10 MED ORDER — ALBUTEROL SULFATE HFA 108 (90 BASE) MCG/ACT IN AERS
1.0000 | INHALATION_SPRAY | Freq: Once | RESPIRATORY_TRACT | Status: AC
  Administered 2018-04-10: 1 via RESPIRATORY_TRACT
  Filled 2018-04-10: qty 6.7

## 2018-04-10 MED ORDER — KETOROLAC TROMETHAMINE 15 MG/ML IJ SOLN
15.0000 mg | Freq: Once | INTRAMUSCULAR | Status: AC
  Administered 2018-04-10: 15 mg via INTRAVENOUS
  Filled 2018-04-10: qty 1

## 2018-04-10 NOTE — ED Triage Notes (Signed)
Per EMS pt has had increasingly worse shortness of breath x2 days with reports of productive brown mucus cough 10 mg albuterol  0.5 Atrovent. Wheezing bilaterally.

## 2018-04-10 NOTE — Discharge Instructions (Signed)
Use inhaler as needed for shortness of breath, wheezing Take over the counter cough/cold medicine Take steroids for the next 5 days Please make a follow up appointment with [facility]. This is a free clinic.

## 2018-04-10 NOTE — ED Notes (Signed)
Pt O2 saturation ranged from 94%-100% while ambulating

## 2018-04-10 NOTE — ED Notes (Signed)
Delay in obtaining labs, RN Rica Mote attempted IV, unable. [place] IV needed.

## 2018-04-10 NOTE — ED Provider Notes (Signed)
[facility] DEPT Provider Note   CSN: [id number] Arrival date & time: 04/10/18  1601     History   Chief Complaint Chief Complaint  Patient presents with  . Cough  . Shortness of Breath    HPI Nancy [name] is a 57 y.o. female who presents with a cough and shortness of breath.  Past medical history significant for hypertension, anemia, stomach cancer.  Patient states that she started having cold symptoms and a cough starting yesterday.  The cough is productive of brown mucus.  Today she tried to go to work however she was becoming very short of breath when she walks.  She felt like she was unable to breathe therefore EMS was called.  She was given a DuoNeb which improved her symptoms.  She reports a fever this morning, sweats.  No chest pain, abdominal pain, nausea or vomiting, leg swelling.  She denies history of pneumonia.  She is unsure of any sick contacts.  She is from [place].  She has not had any recent travel.  She does not smoke  HPI  Past Medical History:  Diagnosis Date  . Dermoid cyst of ovary   . Fibroids   . Hypertension   . Malignant neoplasm of stomach, unspecified site 11/11/2011  . Primary adenocarcinoma of stomach ([place]) 01/2010   T3N2Mx, s/p resection and chemotherapy  . SBO (small bowel obstruction) ([place]) 12/12/2017    Patient Active Problem List   Diagnosis Date Noted  . SBO (small bowel obstruction) ([place]) 12/12/2017  . Malignant neoplasm of stomach, unspecified site 11/11/2011  . Painful Port-a-cath 10/22/2011  . Anemia 05/20/2011  . Dermoid cyst of ovary 04/13/2011  . Fibroids 04/13/2011  . History of Adenocarcinoma of stomach 04/13/2011    Class: History of    Past Surgical History:  Procedure Laterality Date  . BILROTH II PROCEDURE  02/05/10   adenocarcinoma of antrum, Dr. Hulen Skains  . COLONOSCOPY  06/10/2011   Normal  . ESOPHAGOGASTRODUODENOSCOPY  2011   Adenocarcinoma, stomach. Dr. Penelope Coop  . PORT-A-CATH REMOVAL   11/04/2011   Procedure: MINOR REMOVAL PORT-A-CATH;  Surgeon: Belva Crome, MD;  Location: [facility];  Service: General;  Laterality: Left;  . PORTACATH PLACEMENT  03/01/2010   Dr. Hulen Skains     OB History   None      Home Medications    Prior to Admission medications   Medication Sig Start Date End Date Taking? Authorizing Provider  diphenhydrAMINE (BENADRYL) 25 MG tablet Take 1 tablet (25 mg total) by mouth every 6 (six) hours. Patient not taking: Reported on 12/12/2017 10/01/17   Horton, Barbette Hair, MD  famotidine (PEPCID) 20 MG tablet Take 1 tablet (20 mg total) by mouth 2 (two) times daily. Patient not taking: Reported on 12/12/2017 10/01/17   Horton, Barbette Hair, MD  IRON PO Take 1 tablet by mouth 2 (two) times daily.    [provider]  pantoprazole (PROTONIX) 40 MG tablet Take 1 tablet (40 mg total) by mouth 2 (two) times daily. Patient not taking: Reported on 12/12/2017 08/22/12   Linton Flemings, MD  predniSONE (DELTASONE) 20 MG tablet Take 2 tablets (40 mg total) by mouth daily. Patient not taking: Reported on 12/12/2017 10/01/17   Horton, Barbette Hair, MD    Family History Family History  Problem Relation Age of Onset  . Colon cancer Neg Hx   . Stomach cancer Neg Hx     Social History Social History   Tobacco Use  .  Smoking status: Never Smoker  . Smokeless tobacco: Never Used  Substance Use Topics  . Alcohol use: Yes    Comment: occasionally  . Drug use: No     Allergies   Niacin and related   Review of Systems Review of Systems  Constitutional: Positive for diaphoresis and fever.  HENT: Positive for congestion and rhinorrhea.   Respiratory: Positive for cough and shortness of breath.   Cardiovascular: Negative for chest pain and leg swelling.  Gastrointestinal: Negative for abdominal pain, nausea and vomiting.  Allergic/Immunologic: Negative for immunocompromised state.  All other systems reviewed and are negative.    Physical  Exam Updated Vital Signs BP (!) 158/72 (BP Location: Left Arm)   Pulse 96   Temp 98.5 F (36.9 C) (Oral)   Resp (!) 24   Ht 5\' 8"  (1.727 m)   LMP 02/26/2012   SpO2 98%   BMI 31.98 kg/m   Physical Exam  Constitutional: She is oriented to person, place, and time. She appears well-developed and well-nourished. No distress.  Calm and cooperative.  Occasional coarse cough  HENT:  Head: Normocephalic and atraumatic.  Right Ear: Tympanic membrane normal.  Left Ear: Tympanic membrane normal.  Nose: Mucosal edema and rhinorrhea present.  Mouth/Throat: Uvula is midline, oropharynx is clear and moist and mucous membranes are normal.  Eyes: Pupils are equal, round, and reactive to light. Conjunctivae are normal. Right eye exhibits no discharge. Left eye exhibits no discharge. No scleral icterus.  Neck: Normal range of motion.  Cardiovascular: Normal rate and regular rhythm.  Pulmonary/Chest: Effort normal and breath sounds normal. No respiratory distress.  Abdominal: Soft. Bowel sounds are normal. She exhibits no distension. There is no tenderness.  Musculoskeletal:  No peripheral edema  Neurological: She is alert and oriented to person, place, and time.  Skin: Skin is warm and dry.  Psychiatric: She has a normal mood and affect. Her behavior is normal.  Nursing note and vitals reviewed.    ED Treatments / Results  Labs (all labs ordered are listed, but only abnormal results are displayed) Labs Reviewed  BASIC METABOLIC PANEL - Abnormal; Notable for the following components:      Result Value   Glucose, Bld 124 (*)    All other components within normal limits  CBC WITH DIFFERENTIAL/PLATELET - Abnormal; Notable for the following components:   WBC 13.5 (*)    Hemoglobin 11.3 (*)    MCH 25.3 (*)    Neutro Abs 10.8 (*)    All other components within normal limits  TROPONIN I    EKG EKG Interpretation  Date/Time:  Friday April 10 2018 16:16:12 EDT Ventricular Rate:  95 PR  Interval:    QRS Duration: 90 QT Interval:  372 QTC Calculation: 468 R Axis:   87 Text Interpretation:  Sinus rhythm Consider left atrial enlargement Baseline wander in lead(s) V1 since last tracing no significant change Confirmed by Malvin Johns [id number]) on 04/10/2018 4:25:47 PM   Radiology Dg Chest 2 View  Result Date: 04/10/2018 CLINICAL DATA:  Increasing shortness of breath. EXAM: CHEST - 2 VIEW COMPARISON:  Body CT 11/23/2012 FINDINGS: Cardiomediastinal silhouette is normal. Mediastinal contours appear intact. There is no evidence of focal airspace consolidation, pleural effusion or pneumothorax. Osseous structures are without acute abnormality. Soft tissues are grossly normal. IMPRESSION: No active cardiopulmonary disease. Electronically Signed   By: Fidela Salisbury M.D.   On: 04/10/2018 17:02    Procedures Procedures (including critical care time)  Medications Ordered in ED  Medications  albuterol (PROVENTIL HFA;VENTOLIN HFA) 108 (90 Base) MCG/ACT inhaler 1-2 puff (has no administration in time range)  acetaminophen (TYLENOL) tablet 650 mg (has no administration in time range)  ketorolac (TORADOL) 15 MG/ML injection 15 mg (15 mg Intravenous Given 04/10/18 1845)  sodium chloride 0.9 % bolus 1,000 mL (0 mLs Intravenous Stopped 04/10/18 2054)  ipratropium-albuterol (DUONEB) 0.5-2.5 (3) MG/3ML nebulizer solution 3 mL (3 mLs Nebulization Given 04/10/18 1936)  predniSONE (DELTASONE) tablet 60 mg (60 mg Oral Given 04/10/18 1935)     Initial Impression / Assessment and Plan / ED Course  I have reviewed the triage vital signs and the nursing notes.  Pertinent labs & imaging results that were available during my care of the patient were reviewed by me and considered in my medical decision making (see chart for details).  57 year old female presents with cold symptoms, cough, wheezing for the past day.  She now feels short of breath.  She received a breathing treatment by EMS which  significantly improved her symptoms.  She is mildly hypertensive on arrival but otherwise vital signs are normal.  She is not requiring supplemental oxygen.  Will obtain blood work, chest x-ray, EKG and reassess.  7:12 PM On repeat lung exam she is wheezing again. Will order duoneb, steroid, and have her ambulate.  CBC is remarkable for mild leukocytosis and mild anemia.  BMP is normal.  Troponin is normal.  Chest x-ray is normal.  EKG is sinus rhythm.  9:46 PM The patient ambulated without difficulty. She was given an inhaler and rx for steroids. She was given referral to [facility].   Final Clinical Impressions(s) / ED Diagnoses   Final diagnoses:  Acute bronchitis, unspecified organism    ED Discharge Orders    None       Everley, [place], PA-C 04/10/18 2147    Malvin Johns, MD 04/10/18 [phone]

## 2018-04-10 NOTE — ED Notes (Signed)
Bed: WA24 Expected date:  Expected time:  Means of arrival:  Comments: EMS 

## 2018-07-28 ENCOUNTER — Ambulatory Visit: Payer: Self-pay | Admitting: Physician Assistant

## 2018-09-14 ENCOUNTER — Emergency Department (HOSPITAL_COMMUNITY): Payer: Self-pay

## 2018-09-14 ENCOUNTER — Other Ambulatory Visit: Payer: Self-pay

## 2018-09-14 ENCOUNTER — Inpatient Hospital Stay (HOSPITAL_COMMUNITY): Payer: Self-pay

## 2018-09-14 ENCOUNTER — Inpatient Hospital Stay (HOSPITAL_COMMUNITY)
Admission: EM | Admit: 2018-09-14 | Discharge: 2018-09-17 | DRG: 390 | Disposition: A | Discharge status: Home / Self Care | Payer: Self-pay | Attending: General Surgery | Admitting: General Surgery

## 2018-09-14 ENCOUNTER — Encounter (HOSPITAL_COMMUNITY): Payer: Self-pay | Admitting: Emergency Medicine

## 2018-09-14 DIAGNOSIS — I10 Essential (primary) hypertension: Secondary | ICD-10-CM | POA: Diagnosis present

## 2018-09-14 DIAGNOSIS — Z903 Acquired absence of stomach [part of]: Secondary | ICD-10-CM

## 2018-09-14 DIAGNOSIS — Z9221 Personal history of antineoplastic chemotherapy: Secondary | ICD-10-CM

## 2018-09-14 DIAGNOSIS — Z888 Allergy status to other drugs, medicaments and biological substances status: Secondary | ICD-10-CM

## 2018-09-14 DIAGNOSIS — K56609 Unspecified intestinal obstruction, unspecified as to partial versus complete obstruction: Secondary | ICD-10-CM

## 2018-09-14 DIAGNOSIS — K432 Incisional hernia without obstruction or gangrene: Secondary | ICD-10-CM | POA: Diagnosis present

## 2018-09-14 DIAGNOSIS — K565 Intestinal adhesions [bands], unspecified as to partial versus complete obstruction: Principal | ICD-10-CM | POA: Diagnosis present

## 2018-09-14 DIAGNOSIS — Z85028 Personal history of other malignant neoplasm of stomach: Secondary | ICD-10-CM

## 2018-09-14 DIAGNOSIS — E039 Hypothyroidism, unspecified: Secondary | ICD-10-CM | POA: Diagnosis present

## 2018-09-14 LAB — CBC WITH DIFFERENTIAL/PLATELET
Abs Immature Granulocytes: 0.02 10*3/uL (ref 0.00–0.07)
Basophils Absolute: 0 10*3/uL (ref 0.0–0.1)
Basophils Relative: 0 %
EOS ABS: 0 10*3/uL (ref 0.0–0.5)
EOS PCT: 0 %
HCT: 43 % (ref 36.0–46.0)
Hemoglobin: 13 g/dL (ref 12.0–15.0)
Immature Granulocytes: 0 %
LYMPHS PCT: 9 %
Lymphs Abs: 0.9 10*3/uL (ref 0.7–4.0)
MCH: 25.5 pg — ABNORMAL LOW (ref 26.0–34.0)
MCHC: 30.2 g/dL (ref 30.0–36.0)
MCV: 84.3 fL (ref 80.0–100.0)
Monocytes Absolute: 0.4 10*3/uL (ref 0.1–1.0)
Monocytes Relative: 4 %
Neutro Abs: 8.8 10*3/uL — ABNORMAL HIGH (ref 1.7–7.7)
Neutrophils Relative %: 87 %
Platelets: 255 10*3/uL (ref 150–400)
RBC: 5.1 MIL/uL (ref 3.87–5.11)
RDW: 12.9 % (ref 11.5–15.5)
WBC: 10.1 10*3/uL (ref 4.0–10.5)
nRBC: 0 % (ref 0.0–0.2)

## 2018-09-14 LAB — COMPREHENSIVE METABOLIC PANEL
ALT: 18 U/L (ref 0–44)
ANION GAP: 13 (ref 5–15)
AST: 25 U/L (ref 15–41)
Albumin: 4.9 g/dL (ref 3.5–5.0)
Alkaline Phosphatase: 102 U/L (ref 38–126)
BUN: 18 mg/dL (ref 6–20)
CALCIUM: 10.3 mg/dL (ref 8.9–10.3)
CHLORIDE: 102 mmol/L (ref 98–111)
CO2: 24 mmol/L (ref 22–32)
CREATININE: 1 mg/dL (ref 0.44–1.00)
Glucose, Bld: 180 mg/dL — ABNORMAL HIGH (ref 70–99)
Potassium: 4 mmol/L (ref 3.5–5.1)
Sodium: 139 mmol/L (ref 135–145)
Total Bilirubin: 0.6 mg/dL (ref 0.3–1.2)
Total Protein: 8.7 g/dL — ABNORMAL HIGH (ref 6.5–8.1)

## 2018-09-14 LAB — LIPASE, BLOOD: LIPASE: 23 U/L (ref 11–51)

## 2018-09-14 MED ORDER — ONDANSETRON 4 MG PO TBDP: 4.0000 mg | ORAL_TABLET | Freq: Four times a day (QID) | ORAL | Status: DC | PRN

## 2018-09-14 MED ORDER — SODIUM CHLORIDE 0.9 % IV BOLUS
1000.0000 mL | Freq: Once | INTRAVENOUS | Status: AC
  Administered 2018-09-14: 1000 mL via INTRAVENOUS

## 2018-09-14 MED ORDER — SODIUM CHLORIDE (PF) 0.9 % IJ SOLN
INTRAMUSCULAR | Status: AC
  Filled 2018-09-14: qty 50

## 2018-09-14 MED ORDER — HYDROMORPHONE HCL 2 MG/ML IJ SOLN
2.0000 mg | Freq: Once | INTRAMUSCULAR | Status: AC
  Administered 2018-09-14: 2 mg via INTRAMUSCULAR
  Filled 2018-09-14: qty 1

## 2018-09-14 MED ORDER — IOPAMIDOL (ISOVUE-300) INJECTION 61%
INTRAVENOUS | Status: AC
  Filled 2018-09-14: qty 100

## 2018-09-14 MED ORDER — HYDROMORPHONE HCL 1 MG/ML IJ SOLN
1.0000 mg | Freq: Once | INTRAMUSCULAR | Status: DC
  Filled 2018-09-14: qty 1

## 2018-09-14 MED ORDER — ENOXAPARIN SODIUM 40 MG/0.4ML ~~LOC~~ SOLN
40.0000 mg | SUBCUTANEOUS | Status: DC
  Administered 2018-09-14 – 2018-09-17 (×4): 40 mg via SUBCUTANEOUS
  Filled 2018-09-14 (×4): qty 0.4

## 2018-09-14 MED ORDER — ONDANSETRON HCL 4 MG/2ML IJ SOLN: 4.0000 mg | Freq: Once | INTRAMUSCULAR | Status: DC | PRN

## 2018-09-14 MED ORDER — DEXTROSE-NACL 5-0.9 % IV SOLN
INTRAVENOUS | Status: DC
  Administered 2018-09-14 – 2018-09-16 (×5): via INTRAVENOUS

## 2018-09-14 MED ORDER — PANTOPRAZOLE SODIUM 40 MG IV SOLR
40.0000 mg | Freq: Two times a day (BID) | INTRAVENOUS | Status: DC
  Administered 2018-09-14 – 2018-09-17 (×7): 40 mg via INTRAVENOUS
  Filled 2018-09-14 (×7): qty 40

## 2018-09-14 MED ORDER — IOPAMIDOL (ISOVUE-300) INJECTION 61%
100.0000 mL | Freq: Once | INTRAVENOUS | Status: AC | PRN
  Administered 2018-09-14: 100 mL via INTRAVENOUS

## 2018-09-14 MED ORDER — FENTANYL CITRATE (PF) 100 MCG/2ML IJ SOLN: 50.0000 ug | INTRAMUSCULAR | Status: DC | PRN

## 2018-09-14 MED ORDER — DIATRIZOATE MEGLUMINE & SODIUM 66-10 % PO SOLN
90.0000 mL | Freq: Once | ORAL | Status: AC
  Administered 2018-09-14: 90 mL via NASOGASTRIC
  Filled 2018-09-14: qty 90

## 2018-09-14 MED ORDER — ONDANSETRON HCL 4 MG/2ML IJ SOLN
4.0000 mg | Freq: Four times a day (QID) | INTRAMUSCULAR | Status: DC | PRN
  Administered 2018-09-14 – 2018-09-15 (×2): 4 mg via INTRAVENOUS
  Filled 2018-09-14 (×2): qty 2

## 2018-09-14 MED ORDER — PHENOL 1.4 % MT LIQD
1.0000 | OROMUCOSAL | Status: DC | PRN
  Filled 2018-09-14: qty 177

## 2018-09-14 MED ORDER — PROMETHAZINE HCL 25 MG/ML IJ SOLN
12.5000 mg | INTRAMUSCULAR | Status: DC | PRN
  Administered 2018-09-14: 12.5 mg via INTRAVENOUS
  Filled 2018-09-14: qty 1

## 2018-09-14 MED ORDER — PANTOPRAZOLE SODIUM 40 MG IV SOLR: 40.0000 mg | INTRAVENOUS | Status: DC

## 2018-09-14 MED ORDER — HYDRALAZINE HCL 20 MG/ML IJ SOLN: 10.0000 mg | INTRAMUSCULAR | Status: DC | PRN

## 2018-09-14 MED ORDER — ONDANSETRON HCL 4 MG/2ML IJ SOLN
4.0000 mg | Freq: Once | INTRAMUSCULAR | Status: AC
  Administered 2018-09-14: 4 mg via INTRAVENOUS

## 2018-09-14 MED ORDER — ONDANSETRON 8 MG PO TBDP
8.0000 mg | ORAL_TABLET | Freq: Once | ORAL | Status: AC
  Administered 2018-09-14: 8 mg via ORAL
  Filled 2018-09-14: qty 1

## 2018-09-14 MED ORDER — ONDANSETRON HCL 4 MG/2ML IJ SOLN
4.0000 mg | Freq: Once | INTRAMUSCULAR | Status: DC
  Filled 2018-09-14: qty 2

## 2018-09-14 MED ORDER — MORPHINE SULFATE (PF) 2 MG/ML IV SOLN
1.0000 mg | INTRAVENOUS | Status: DC | PRN
  Administered 2018-09-14: 2 mg via INTRAVENOUS
  Administered 2018-09-14: 1 mg via INTRAVENOUS
  Administered 2018-09-15 – 2018-09-16 (×2): 2 mg via INTRAVENOUS
  Filled 2018-09-14 (×4): qty 1

## 2018-09-14 NOTE — ED Notes (Signed)
Pt had one emesis episode.

## 2018-09-14 NOTE — ED Notes (Signed)
Bed: EX51 Expected date:  Expected time:  Means of arrival:  Comments: 57 yo F/abd pain

## 2018-09-14 NOTE — ED Notes (Addendum)
NG tube clamped for transport

## 2018-09-14 NOTE — ED Notes (Signed)
ED TO INPATIENT HANDOFF REPORT  Name/Age/Gender Nancy [name] 57 y.o. female  Code Status    Code Status Orders  (From admission, onward)         Start     Ordered   09/14/18 0637  Full code  Continuous     09/14/18 0638        Code Status History    Date Active Date Inactive Code Status Order ID Comments User Context   12/12/2017 1143 12/16/2017 1257 Full Code [id number]  [place]      Home/SNF/Other Home  Chief Complaint Abd pain  Level of Care/Admitting Diagnosis ED Disposition    ED Disposition Condition [facility] Area: [facility] [[id number]]  Level of Care: Med-Surg [16]  Diagnosis: SBO (small bowel obstruction) ([place]) [[id number]]  Admitting Physician: [place], [place]  Attending Physician: CCS, MD [[id number]]  Estimated length of stay: past midnight tomorrow  Certification:: I certify this patient will need inpatient services for at least 2 midnights  Bed request comments: 5w  PT Class (Do Not Modify): Inpatient [101]  PT Acc Code (Do Not Modify): Private [1]       Medical History Past Medical History:  Diagnosis Date  . Dermoid cyst of ovary   . Fibroids   . Hypertension   . Malignant neoplasm of stomach, unspecified site 11/11/2011  . 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 SBO (small bowel obstruction) ([place]) 12/12/2017    Allergies Allergies  Allergen Reactions  . Niacin And Related Other (See Comments)    flushing    IV Location/Drains/Wounds Patient Lines/Drains/Airways Status   Active Line/Drains/Airways    Name:   Placement date:   Placement time:   Site:   Days:   Peripheral IV 09/14/18 Right Antecubital   09/14/18    0223    Antecubital   less than 1   NG/OG Tube Nasogastric 14 Fr. Right nare Xray   09/14/18    0425    Right nare   less than 1          Labs/Imaging Results for orders placed or performed during the hospital encounter of  09/14/18 (from the past 48 hour(s))  Lipase, blood     Status: None   Collection Time: 09/14/18  2:26 AM  Result Value Ref Range   Lipase 23 11 - 51 U/L    Comment: Performed at [facility], [place] [address], [place], [place] [address]  Comprehensive metabolic panel     Status: Abnormal   Collection Time: 09/14/18  2:26 AM  Result Value Ref Range   Sodium 139 135 - 145 mmol/L   Potassium 4.0 3.5 - 5.1 mmol/L   Chloride 102 98 - 111 mmol/L   CO2 24 22 - 32 mmol/L   Glucose, Bld 180 (H) 70 - 99 mg/dL   BUN 18 6 - 20 mg/dL   Creatinine, Ser 1.00 0.44 - 1.00 mg/dL   Calcium 10.3 8.9 - 10.3 mg/dL   Total Protein 8.7 (H) 6.5 - 8.1 g/dL   Albumin 4.9 3.5 - 5.0 g/dL   AST 25 15 - 41 U/L   ALT 18 0 - 44 U/L   Alkaline Phosphatase 102 38 - 126 U/L   Total Bilirubin 0.6 0.3 - 1.2 mg/dL   GFR calc non Af Amer >60 >60 mL/min   GFR calc Af Amer >60 >60 mL/min  Anion gap 13 5 - 15    Comment: Performed at [facility], [place] [address], [place], [place] [address]  CBC with Differential/Platelet     Status: Abnormal   Collection Time: 09/14/18  2:26 AM  Result Value Ref Range   WBC 10.1 4.0 - 10.5 K/uL   RBC 5.10 3.87 - 5.11 MIL/uL   Hemoglobin 13.0 12.0 - 15.0 g/dL   HCT 43.0 36.0 - 46.0 %   MCV 84.3 80.0 - 100.0 fL   MCH 25.5 (L) 26.0 - 34.0 pg   MCHC 30.2 30.0 - 36.0 g/dL   RDW 12.9 11.5 - 15.5 %   Platelets 255 150 - 400 K/uL   nRBC 0.0 0.0 - 0.2 %   Neutrophils Relative % 87 %   Neutro Abs 8.8 (H) 1.7 - 7.7 K/uL   Lymphocytes Relative 9 %   Lymphs Abs 0.9 0.7 - 4.0 K/uL   Monocytes Relative 4 %   Monocytes Absolute 0.4 0.1 - 1.0 K/uL   Eosinophils Relative 0 %   Eosinophils Absolute 0.0 0.0 - 0.5 K/uL   Basophils Relative 0 %   Basophils Absolute 0.0 0.0 - 0.1 K/uL   Immature Granulocytes 0 %   Abs Immature Granulocytes 0.02 0.00 - 0.07 K/uL    Comment: Performed at [facility], [place] [address] [place], [place] [address]    Ct Abdomen Pelvis W Contrast  Result Date: 09/14/2018 CLINICAL DATA:  Initial evaluation for acute abdominal pain. History of gastric cancer, SBO, pancreatitis. EXAM: CT ABDOMEN AND PELVIS WITH CONTRAST TECHNIQUE: Multidetector CT imaging of the abdomen and pelvis was performed using the standard protocol following bolus administration of intravenous contrast. CONTRAST:  164mL ISOVUE-300 IOPAMIDOL (ISOVUE-300) INJECTION 61% COMPARISON:  Prior CT from 12/12/2017 FINDINGS: Lower chest: Scattered subsegmental atelectatic changes seen dependently within the visualized lung bases. Visualized lungs are otherwise clear. Hepatobiliary: Liver demonstrates a normal contrast enhanced appearance. Layering hyperdensity within the gallbladder likely reflects cholelithiasis. No imaging findings to suggest acute cholecystitis. No biliary dilatation. Pancreas: Pancreas somewhat atrophic without acute abnormality. Spleen: Spleen within normal limits. Adrenals/Urinary Tract: Adrenal glands are normal. Kidneys equal in size with symmetric enhancement. No nephrolithiasis, hydronephrosis, or focal enhancing renal mass. No hydroureter. Partially distended bladder within normal limits. Stomach/Bowel: In G-tube in place with tip terminating in the distal stomach. Patient appears to be status post partial gastrectomy. Small bowel obstruction with multiple dilated loops of small bowel seen within the left and mid abdomen, measuring up to 4 cm in diameter. Fecalization of small bowel consistent with slow transit. There is a focal transition point just deep to the umbilicus in the mid abdomen (series 2, image 42). No pneumatosis or portal venous gas. This is similar to previous CT from 12/12/2017. Small bowel decompressed distally. Colon relatively of normal caliber. No other acute inflammatory changes seen about the bowels. Vascular/Lymphatic: Normal intravascular enhancement seen throughout the intra-abdominal aorta. Mild aortic  atherosclerosis without aneurysm. Mesenteric vessels patent proximally. No adenopathy. Reproductive: Uterus within normal limits. Left ovary unremarkable. 6 cm right ovarian dermoid again noted, stable. Other: No free air. Small volume free perihepatic fluid, likely reactive. Small fat containing paraumbilical hernia containing a small amount of free fluid noted. Additional small ventral hernia within the upper abdomen contains fat and a short-segment portion of a loop of colon. No associated obstruction or inflammation (series 2, image 36). Musculoskeletal: No acute osseous abnormality. No discrete lytic or blastic osseous lesions. IMPRESSION: 1. Findings consistent  with small bowel obstruction with focal transition point just deep to the umbilicus in the mid abdomen, similar to previous CT from 12/12/2017. 2. No other acute intra-abdominal or pelvic process identified. 3. Cholelithiasis. 4. Stable right ovarian dermoid. Electronically Signed   By: Jeannine Boga M.D.   On: 09/14/2018 04:56   Dg Abd Acute W/chest  Result Date: 09/14/2018 CLINICAL DATA:  57 year old female with epigastric pain. EXAM: DG ABDOMEN ACUTE W/ 1V CHEST COMPARISON:  Chest radiograph dated 04/08/2018, abdominal radiograph dated 12/14/2017 and CT dated 03/01 FINDINGS: The lungs are clear. There is no pleural effusion or pneumothorax. Stable mild cardiomegaly. There is moderate stool throughout the colon. Dilated and air-filled loops of small bowel measure up to 5 cm in diameter. No free air identified. Surgical suture material noted in the upper abdomen. No acute osseous pathology. IMPRESSION: 1. No acute cardiopulmonary process. 2. Dilated small bowel loops concerning for obstruction in this patient with prior history of bowel obstruction. 3. Moderate colonic stool burden. Electronically Signed   By: Anner Crete M.D.   On: 09/14/2018 03:28    Pending Labs Unresulted Labs (From admission, onward)    Start     Ordered    09/15/18 7169  Basic metabolic panel  Tomorrow morning,   R     09/14/18 0638   09/15/18 0500  CBC  Tomorrow morning,   R     09/14/18 0638   09/14/18 0142  Urinalysis, Routine w reflex microscopic  ONCE - STAT,   STAT     09/14/18 0142          Vitals/Pain Today's Vitals   09/14/18 0330 09/14/18 0400 09/14/18 0407 09/14/18 0606  BP: (!) 160/91 (!) 166/91 (!) 166/91 (!) 178/80  Pulse: 65 63 64 64  Resp: (!) 21 20 (!) 21 (!) 25  Temp:      TempSrc:      SpO2: 100% 100% 100% 93%  PainSc:        Isolation Precautions No active isolations  Medications Medications  iopamidol (ISOVUE-300) 61 % injection (has no administration in time range)  sodium chloride (PF) 0.9 % injection (has no administration in time range)  diatrizoate meglumine-sodium (GASTROGRAFIN) 66-10 % solution 90 mL (has no administration in time range)  enoxaparin (LOVENOX) injection 40 mg (has no administration in time range)  dextrose 5 %-0.9 % sodium chloride infusion (has no administration in time range)  ondansetron (ZOFRAN-ODT) disintegrating tablet 4 mg (has no administration in time range)    Or  ondansetron (ZOFRAN) injection 4 mg (has no administration in time range)  hydrALAZINE (APRESOLINE) injection 10 mg (has no administration in time range)  sodium chloride 0.9 % bolus 1,000 mL (0 mLs Intravenous Stopped 09/14/18 0425)  ondansetron (ZOFRAN-ODT) disintegrating tablet 8 mg (8 mg Oral Given 09/14/18 0205)  HYDROmorphone (DILAUDID) injection 2 mg (2 mg Intramuscular Given 09/14/18 0205)  ondansetron (ZOFRAN) injection 4 mg (4 mg Intravenous Given 09/14/18 0225)  iopamidol (ISOVUE-300) 61 % injection 100 mL (100 mLs Intravenous Contrast Given 09/14/18 0428)    Mobility walks

## 2018-09-14 NOTE — Progress Notes (Signed)
    CC: History of stomach cancer/SBO  Subjective: She is sleepy after Phenergan.  Getting gastrografin now. Fair amount of drainage from NG so far.  BP better after wretching stopped.  Objective: Vital signs in last 24 hours: Temp:  [97.4 F (36.3 C)-97.6 F (36.4 C)] 97.4 F (36.3 C) (12/02 0824) Pulse Rate:  [62-69] 69 (12/02 0936) Resp:  [12-25] 17 (12/02 0936) BP: (143-186)/(66-134) 178/95 (12/02 0936) SpO2:  [93 %-100 %] 94 % (12/02 0936)  1000 IV Nothing recorded from the NG her urine so far Afebrile blood pressure somewhat elevated. Labs on admission are all within normal limits, except a glucose of 180. Acute abdomen film on admission: Dilated small bowel loops questionable obstruction, moderate colonic stool burden. CT scan 12/2:Small fat containing paraumbilical hernia containing a small amount of free fluid noted. Additional small ventral hernia within the upper abdomen contains fat and a short-segment portion of a loop of colon. No associated obstruction or inflammation (series   Intake/Output from previous day: 12/01 0701 - 12/02 0700 In: 1000 [IV Piggyback:1000] Out: -  Intake/Output this shift: Total I/O In: 0  Out: 150 [Emesis/NG output:150]  General appearance: alert, cooperative and no distress GI: Not overly distended, soft, BS high pitched.   not all that tender, but she just got Phenergan.  Lab Results:  Recent Labs    09/14/18 0226  WBC 10.1  HGB 13.0  HCT 43.0  PLT 255    BMET Recent Labs    09/14/18 0226  NA 139  K 4.0  CL 102  CO2 24  GLUCOSE 180*  BUN 18  CREATININE 1.00  CALCIUM 10.3   PT/INR No results for input(s): LABPROT, INR in the last 72 hours.  Recent Labs  Lab 09/14/18 0226  AST 25  ALT 18  ALKPHOS 102  BILITOT 0.6  PROT 8.7*  ALBUMIN 4.9     Lipase     Component Value Date/Time   LIPASE 23 09/14/2018 0226     Prior to Admission medications   Medication Sig Start Date End Date Taking?  Authorizing Provider  ferrous sulfate 325 (65 FE) MG EC tablet Take 325 mg by mouth 2 (two) times daily.   Yes [provider]  predniSONE (DELTASONE) 20 MG tablet Take 2 tablets (40 mg total) by mouth daily. Patient not taking: Reported on 09/14/2018 04/10/18   Recardo Evangelist, PA-C   Medications: . enoxaparin (LOVENOX) injection  40 mg Subcutaneous Q24H  . iopamidol      . sodium chloride (PF)       . dextrose 5 % and 0.9% NaCl 100 mL/hr at 09/14/18 1018   Assessment/Plan Hypertension Hypothyroid   SBO Hx adenocarcinoma of the stomach T3N2, subtotal gastrectomy/B2 anastomosis 2011   FEN: IV fluids/n.p.o. ID: None DVT: Lovenox Follow-up: To be determined   Plan:  NG decompression, SB protocol, IV hydration.  Bowel rest.  LOS: 0 days    Antino Mayabb 09/14/2018 [phone]

## 2018-09-14 NOTE — ED Provider Notes (Signed)
[facility] DEPT Provider Note: Georgena Spurling, MD, FACEP  CSN: [id number] MRN: [id number] ARRIVAL: 09/14/18 at [place]  Abdominal Pain   HISTORY OF PRESENT ILLNESS  09/14/18 2:01 AM Nancy [name] is a 57 y.o. female with a history of stomach cancer status post resection and chemotherapy.  She also has a history of small bowel obstruction and possibly pancreatitis.  She is here with epigastric pain that began about 5 PM yesterday afternoon.  The pain is severe and is been associated with nausea and vomiting but no diarrhea.  It is worse with movement or palpation.  Her abdomen feels distended.    Past Medical History:  Diagnosis Date  . Dermoid cyst of ovary   . Fibroids   . Hypertension   . Malignant neoplasm of stomach, unspecified site 11/11/2011  . 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 SBO (small bowel obstruction) ([place]) 12/12/2017    Past Surgical History:  Procedure Laterality Date  . BILROTH II PROCEDURE  02/05/10   adenocarcinoma of antrum, Dr. Hulen Skains  . COLONOSCOPY  06/10/2011   Normal  . ESOPHAGOGASTRODUODENOSCOPY  2011   Adenocarcinoma, stomach. Dr. Penelope Coop  . PORT-A-CATH REMOVAL  11/04/2011   Procedure: MINOR REMOVAL PORT-A-CATH;  Surgeon: Belva Crome, MD;  Location: [facility];  Service: General;  Laterality: Left;  . PORTACATH PLACEMENT  03/01/2010   Dr. Hulen Skains    Family History  Problem Relation Age of Onset  . Colon cancer Neg Hx   . Stomach cancer Neg Hx     Social History   Tobacco Use  . Smoking status: Never Smoker  . Smokeless tobacco: Never Used  Substance Use Topics  . Alcohol use: Yes    Comment: occasionally  . Drug use: No    Prior to Admission medications   Medication Sig Start Date End Date Taking? Authorizing Provider  ferrous sulfate 325 (65 FE) MG EC tablet Take 325 mg by mouth 2 (two) times daily.   Yes [provider]  predniSONE (DELTASONE) 20 MG tablet Take 2 tablets (40 mg total) by mouth daily. Patient not taking: Reported on 09/14/2018 04/10/18   Recardo Evangelist, PA-C    Allergies Niacin and related   REVIEW OF SYSTEMS  Negative except as noted here or in the History of Present Illness.   PHYSICAL EXAMINATION  Initial Vital Signs Blood pressure (!) 169/93, pulse 62, temperature 97.6 F (36.4 C), temperature source Oral, resp. rate 20, last menstrual period 02/26/2012, SpO2 100 %.  Examination General: Well-developed, well-nourished female in apparent discomfort; appearance consistent with age of record HENT: normocephalic; atraumatic Eyes: pupils equal, round and reactive to light; extraocular muscles intact Neck: supple Heart: regular rate and rhythm Lungs: clear to auscultation bilaterally Abdomen: soft; mildly distended; epigastric tenderness; bowel sounds hypoactive Extremities: No deformity; full range of motion; pulses normal Neurologic: Awake, alert and oriented; motor function intact in all extremities and symmetric; no facial droop Skin: Warm and dry Psychiatric: Moaning   RESULTS  Summary of this visit's results, reviewed by myself:   EKG Interpretation  Date/Time:    Ventricular Rate:    PR Interval:    QRS Duration:   QT Interval:    QTC Calculation:   R Axis:     Text Interpretation:        Laboratory Studies: Results for orders placed or performed during the hospital encounter of 09/14/18 (from  the past 24 hour(s))  Lipase, blood     Status: None   Collection Time: 09/14/18  2:26 AM  Result Value Ref Range   Lipase 23 11 - 51 U/L  Comprehensive metabolic panel     Status: Abnormal   Collection Time: 09/14/18  2:26 AM  Result Value Ref Range   Sodium 139 135 - 145 mmol/L   Potassium 4.0 3.5 - 5.1 mmol/L   Chloride 102 98 - 111 mmol/L   CO2 24 22 - 32 mmol/L   Glucose, Bld 180 (H) 70 - 99 mg/dL   BUN 18 6 - 20 mg/dL   Creatinine, Ser 1.00 0.44 -  1.00 mg/dL   Calcium 10.3 8.9 - 10.3 mg/dL   Total Protein 8.7 (H) 6.5 - 8.1 g/dL   Albumin 4.9 3.5 - 5.0 g/dL   AST 25 15 - 41 U/L   ALT 18 0 - 44 U/L   Alkaline Phosphatase 102 38 - 126 U/L   Total Bilirubin 0.6 0.3 - 1.2 mg/dL   GFR calc non Af Amer >60 >60 mL/min   GFR calc Af Amer >60 >60 mL/min   Anion gap 13 5 - 15  CBC with Differential/Platelet     Status: Abnormal   Collection Time: 09/14/18  2:26 AM  Result Value Ref Range   WBC 10.1 4.0 - 10.5 K/uL   RBC 5.10 3.87 - 5.11 MIL/uL   Hemoglobin 13.0 12.0 - 15.0 g/dL   HCT 43.0 36.0 - 46.0 %   MCV 84.3 80.0 - 100.0 fL   MCH 25.5 (L) 26.0 - 34.0 pg   MCHC 30.2 30.0 - 36.0 g/dL   RDW 12.9 11.5 - 15.5 %   Platelets 255 150 - 400 K/uL   nRBC 0.0 0.0 - 0.2 %   Neutrophils Relative % 87 %   Neutro Abs 8.8 (H) 1.7 - 7.7 K/uL   Lymphocytes Relative 9 %   Lymphs Abs 0.9 0.7 - 4.0 K/uL   Monocytes Relative 4 %   Monocytes Absolute 0.4 0.1 - 1.0 K/uL   Eosinophils Relative 0 %   Eosinophils Absolute 0.0 0.0 - 0.5 K/uL   Basophils Relative 0 %   Basophils Absolute 0.0 0.0 - 0.1 K/uL   Immature Granulocytes 0 %   Abs Immature Granulocytes 0.02 0.00 - 0.07 K/uL   Imaging Studies: Ct Abdomen Pelvis W Contrast  Result Date: 09/14/2018 CLINICAL DATA:  Initial evaluation for acute abdominal pain. History of gastric cancer, SBO, pancreatitis. EXAM: CT ABDOMEN AND PELVIS WITH CONTRAST TECHNIQUE: Multidetector CT imaging of the abdomen and pelvis was performed using the standard protocol following bolus administration of intravenous contrast. CONTRAST:  114mL ISOVUE-300 IOPAMIDOL (ISOVUE-300) INJECTION 61% COMPARISON:  Prior CT from 12/12/2017 FINDINGS: Lower chest: Scattered subsegmental atelectatic changes seen dependently within the visualized lung bases. Visualized lungs are otherwise clear. Hepatobiliary: Liver demonstrates a normal contrast enhanced appearance. Layering hyperdensity within the gallbladder likely reflects  cholelithiasis. No imaging findings to suggest acute cholecystitis. No biliary dilatation. Pancreas: Pancreas somewhat atrophic without acute abnormality. Spleen: Spleen within normal limits. Adrenals/Urinary Tract: Adrenal glands are normal. Kidneys equal in size with symmetric enhancement. No nephrolithiasis, hydronephrosis, or focal enhancing renal mass. No hydroureter. Partially distended bladder within normal limits. Stomach/Bowel: In G-tube in place with tip terminating in the distal stomach. Patient appears to be status post partial gastrectomy. Small bowel obstruction with multiple dilated loops of small bowel seen within the left and mid abdomen, measuring up to 4 cm  in diameter. Fecalization of small bowel consistent with slow transit. There is a focal transition point just deep to the umbilicus in the mid abdomen (series 2, image 42). No pneumatosis or portal venous gas. This is similar to previous CT from 12/12/2017. Small bowel decompressed distally. Colon relatively of normal caliber. No other acute inflammatory changes seen about the bowels. Vascular/Lymphatic: Normal intravascular enhancement seen throughout the intra-abdominal aorta. Mild aortic atherosclerosis without aneurysm. Mesenteric vessels patent proximally. No adenopathy. Reproductive: Uterus within normal limits. Left ovary unremarkable. 6 cm right ovarian dermoid again noted, stable. Other: No free air. Small volume free perihepatic fluid, likely reactive. Small fat containing paraumbilical hernia containing a small amount of free fluid noted. Additional small ventral hernia within the upper abdomen contains fat and a short-segment portion of a loop of colon. No associated obstruction or inflammation (series 2, image 36). Musculoskeletal: No acute osseous abnormality. No discrete lytic or blastic osseous lesions. IMPRESSION: 1. Findings consistent with small bowel obstruction with focal transition point just deep to the umbilicus in the  mid abdomen, similar to previous CT from 12/12/2017. 2. No other acute intra-abdominal or pelvic process identified. 3. Cholelithiasis. 4. Stable right ovarian dermoid. Electronically Signed   By: Jeannine Boga M.D.   On: 09/14/2018 04:56   Dg Abd Acute W/chest  Result Date: 09/14/2018 CLINICAL DATA:  57 year old female with epigastric pain. EXAM: DG ABDOMEN ACUTE W/ 1V CHEST COMPARISON:  Chest radiograph dated 04/08/2018, abdominal radiograph dated 12/14/2017 and CT dated 03/01 FINDINGS: The lungs are clear. There is no pleural effusion or pneumothorax. Stable mild cardiomegaly. There is moderate stool throughout the colon. Dilated and air-filled loops of small bowel measure up to 5 cm in diameter. No free air identified. Surgical suture material noted in the upper abdomen. No acute osseous pathology. IMPRESSION: 1. No acute cardiopulmonary process. 2. Dilated small bowel loops concerning for obstruction in this patient with prior history of bowel obstruction. 3. Moderate colonic stool burden. Electronically Signed   By: Anner Crete M.D.   On: 09/14/2018 03:28    ED COURSE and MDM  Nursing notes and initial vitals signs, including pulse oximetry, reviewed.  Vitals:   09/14/18 0330 09/14/18 0400 09/14/18 0407 09/14/18 0606  BP: (!) 160/91 (!) 166/91 (!) 166/91 (!) 178/80  Pulse: 65 63 64 64  Resp: (!) 21 20 (!) 21 (!) 25  Temp:      TempSrc:      SpO2: 100% 100% 100% 93%   6:47 AM Dr. Rosendo Gros in to see patient.  Nasogastric tube placed by nursing staff after plain films showed likely small bowel obstruction.  This was discussed with Dr. Rosendo Gros who recommended a CT for further elucidation.  PROCEDURES    ED DIAGNOSES     ICD-10-CM   1. Small bowel obstruction (HCC) K56.609 DG Abd Portable 1V-Small Bowel Obstruction Protocol-initial, 8 hr delay    DG Abd Portable 1V-Small Bowel Obstruction Protocol-initial, 8 hr delay       Tawsha Terrero, Jenny Reichmann, MD 09/14/18 [phone]

## 2018-09-14 NOTE — ED Triage Notes (Signed)
Pt presents with abd pain. Hx stomach cancer, SBO, and pancreatitis. Patient complaining of RUQ and epigastric pain with pain on palpation. Patient actively vomiting on EMS stretcher.

## 2018-09-14 NOTE — H&P (Signed)
Nancy [name] is an 57 y.o. female.   Chief Complaint: Abdominal pain HPI: Patient is a 58 year old female with a history of gastric adenocarcinoma who underwent subtotal gastrectomy and B2 anastomosis in 2011.  Patient also has history of hypertension, hypothyroidism.  Patient states that she began with abdominal pain yesterday afternoon approximately 4 PM.  She states that thereafter she had some nausea and vomiting.  She states that the pain continued and presented to the ER for further evaluation and management.  Upon evaluation the ER she underwent KUB as well as CT scan which was consistent with small bowel obstruction as well as several small incisional hernia/ventral hernias.  The hernias do not appear to be the point of transition.  I did review the CT scans personally.  Patient had NG tube placed within the ER.  Past Medical History:  Diagnosis Date  . Dermoid cyst of ovary   . Fibroids   . Hypertension   . Malignant neoplasm of stomach, unspecified site 11/11/2011  . Primary adenocarcinoma of stomach ([place]) 01/2010   T3N2Mx, s/p resection and chemotherapy  . SBO (small bowel obstruction) ([place]) 12/12/2017    Past Surgical History:  Procedure Laterality Date  . BILROTH II PROCEDURE  02/05/10   adenocarcinoma of antrum, Dr. Hulen Skains  . COLONOSCOPY  06/10/2011   Normal  . ESOPHAGOGASTRODUODENOSCOPY  2011   Adenocarcinoma, stomach. Dr. Penelope Coop  . PORT-A-CATH REMOVAL  11/04/2011   Procedure: MINOR REMOVAL PORT-A-CATH;  Surgeon: Belva Crome, MD;  Location: [facility];  Service: General;  Laterality: Left;  . PORTACATH PLACEMENT  03/01/2010   Dr. Hulen Skains    Family History  Problem Relation Age of Onset  . Colon cancer Neg Hx   . Stomach cancer Neg Hx    Social History:  reports that she has never smoked. She has never used smokeless tobacco. She reports that she drinks alcohol. She reports that she does not use drugs.  Allergies:  Allergies  Allergen Reactions   . Niacin And Related Other (See Comments)    flushing     (Not in a hospital admission)  Results for orders placed or performed during the hospital encounter of 09/14/18 (from the past 48 hour(s))  Lipase, blood     Status: None   Collection Time: 09/14/18  2:26 AM  Result Value Ref Range   Lipase 23 11 - 51 U/L    Comment: Performed at [facility], [place] [address] [place], [place] [address]  Comprehensive metabolic panel     Status: Abnormal   Collection Time: 09/14/18  2:26 AM  Result Value Ref Range   Sodium 139 135 - 145 mmol/L   Potassium 4.0 3.5 - 5.1 mmol/L   Chloride 102 98 - 111 mmol/L   CO2 24 22 - 32 mmol/L   Glucose, Bld 180 (H) 70 - 99 mg/dL   BUN 18 6 - 20 mg/dL   Creatinine, Ser 1.00 0.44 - 1.00 mg/dL   Calcium 10.3 8.9 - 10.3 mg/dL   Total Protein 8.7 (H) 6.5 - 8.1 g/dL   Albumin 4.9 3.5 - 5.0 g/dL   AST 25 15 - 41 U/L   ALT 18 0 - 44 U/L   Alkaline Phosphatase 102 38 - 126 U/L   Total Bilirubin 0.6 0.3 - 1.2 mg/dL   GFR calc non Af Amer >60 >60 mL/min   GFR calc Af Amer >60 >60 mL/min   Anion gap 13 5 - 15  Comment: Performed at [facility], [place] [address], [place], [place] [address]  CBC with Differential/Platelet     Status: Abnormal   Collection Time: 09/14/18  2:26 AM  Result Value Ref Range   WBC 10.1 4.0 - 10.5 K/uL   RBC 5.10 3.87 - 5.11 MIL/uL   Hemoglobin 13.0 12.0 - 15.0 g/dL   HCT 43.0 36.0 - 46.0 %   MCV 84.3 80.0 - 100.0 fL   MCH 25.5 (L) 26.0 - 34.0 pg   MCHC 30.2 30.0 - 36.0 g/dL   RDW 12.9 11.5 - 15.5 %   Platelets 255 150 - 400 K/uL   nRBC 0.0 0.0 - 0.2 %   Neutrophils Relative % 87 %   Neutro Abs 8.8 (H) 1.7 - 7.7 K/uL   Lymphocytes Relative 9 %   Lymphs Abs 0.9 0.7 - 4.0 K/uL   Monocytes Relative 4 %   Monocytes Absolute 0.4 0.1 - 1.0 K/uL   Eosinophils Relative 0 %   Eosinophils Absolute 0.0 0.0 - 0.5 K/uL   Basophils Relative 0 %   Basophils Absolute 0.0 0.0 - 0.1 K/uL   Immature  Granulocytes 0 %   Abs Immature Granulocytes 0.02 0.00 - 0.07 K/uL    Comment: Performed at [facility], [place] [address], [place], [place] [address]   Ct Abdomen Pelvis W Contrast  Result Date: 09/14/2018 CLINICAL DATA:  Initial evaluation for acute abdominal pain. History of gastric cancer, SBO, pancreatitis. EXAM: CT ABDOMEN AND PELVIS WITH CONTRAST TECHNIQUE: Multidetector CT imaging of the abdomen and pelvis was performed using the standard protocol following bolus administration of intravenous contrast. CONTRAST:  157mL ISOVUE-300 IOPAMIDOL (ISOVUE-300) INJECTION 61% COMPARISON:  Prior CT from 12/12/2017 FINDINGS: Lower chest: Scattered subsegmental atelectatic changes seen dependently within the visualized lung bases. Visualized lungs are otherwise clear. Hepatobiliary: Liver demonstrates a normal contrast enhanced appearance. Layering hyperdensity within the gallbladder likely reflects cholelithiasis. No imaging findings to suggest acute cholecystitis. No biliary dilatation. Pancreas: Pancreas somewhat atrophic without acute abnormality. Spleen: Spleen within normal limits. Adrenals/Urinary Tract: Adrenal glands are normal. Kidneys equal in size with symmetric enhancement. No nephrolithiasis, hydronephrosis, or focal enhancing renal mass. No hydroureter. Partially distended bladder within normal limits. Stomach/Bowel: In G-tube in place with tip terminating in the distal stomach. Patient appears to be status post partial gastrectomy. Small bowel obstruction with multiple dilated loops of small bowel seen within the left and mid abdomen, measuring up to 4 cm in diameter. Fecalization of small bowel consistent with slow transit. There is a focal transition point just deep to the umbilicus in the mid abdomen (series 2, image 42). No pneumatosis or portal venous gas. This is similar to previous CT from 12/12/2017. Small bowel decompressed distally. Colon relatively of normal caliber. No  other acute inflammatory changes seen about the bowels. Vascular/Lymphatic: Normal intravascular enhancement seen throughout the intra-abdominal aorta. Mild aortic atherosclerosis without aneurysm. Mesenteric vessels patent proximally. No adenopathy. Reproductive: Uterus within normal limits. Left ovary unremarkable. 6 cm right ovarian dermoid again noted, stable. Other: No free air. Small volume free perihepatic fluid, likely reactive. Small fat containing paraumbilical hernia containing a small amount of free fluid noted. Additional small ventral hernia within the upper abdomen contains fat and a short-segment portion of a loop of colon. No associated obstruction or inflammation (series 2, image 36). Musculoskeletal: No acute osseous abnormality. No discrete lytic or blastic osseous lesions. IMPRESSION: 1. Findings consistent with small bowel obstruction with focal transition point just  deep to the umbilicus in the mid abdomen, similar to previous CT from 12/12/2017. 2. No other acute intra-abdominal or pelvic process identified. 3. Cholelithiasis. 4. Stable right ovarian dermoid. Electronically Signed   By: Jeannine Boga M.D.   On: 09/14/2018 04:56   Dg Abd Acute W/chest  Result Date: 09/14/2018 CLINICAL DATA:  57 year old female with epigastric pain. EXAM: DG ABDOMEN ACUTE W/ 1V CHEST COMPARISON:  Chest radiograph dated 04/08/2018, abdominal radiograph dated 12/14/2017 and CT dated 03/01 FINDINGS: The lungs are clear. There is no pleural effusion or pneumothorax. Stable mild cardiomegaly. There is moderate stool throughout the colon. Dilated and air-filled loops of small bowel measure up to 5 cm in diameter. No free air identified. Surgical suture material noted in the upper abdomen. No acute osseous pathology. IMPRESSION: 1. No acute cardiopulmonary process. 2. Dilated small bowel loops concerning for obstruction in this patient with prior history of bowel obstruction. 3. Moderate colonic stool  burden. Electronically Signed   By: Anner Crete M.D.   On: 09/14/2018 03:28    Review of Systems  Constitutional: Negative for chills, fever and malaise/fatigue.  HENT: Negative for ear discharge, hearing loss and sore throat.   Eyes: Negative for blurred vision and discharge.  Respiratory: Negative for cough and shortness of breath.   Cardiovascular: Negative for chest pain, orthopnea and leg swelling.  Gastrointestinal: Positive for abdominal pain, nausea and vomiting. Negative for constipation, diarrhea and heartburn.  Musculoskeletal: Negative for myalgias and neck pain.  Skin: Negative for itching and rash.  Neurological: Negative for dizziness, focal weakness, seizures and loss of consciousness.  Endo/Heme/Allergies: Negative for environmental allergies. Does not bruise/bleed easily.  Psychiatric/Behavioral: Negative for depression and suicidal ideas.  All other systems reviewed and are negative.   Blood pressure (!) 178/80, pulse 64, temperature 97.6 F (36.4 C), temperature source Oral, resp. rate (!) 25, last menstrual period 02/26/2012, SpO2 93 %. Physical Exam  Constitutional: She is oriented to person, place, and time. Vital signs are normal. She appears well-developed and well-nourished.  Conversant No acute distress  Eyes: Lids are normal. No scleral icterus.  No lid lag Moist conjunctiva  Neck: No tracheal tenderness present. No thyromegaly present.  No cervical lymphadenopathy  Cardiovascular: Normal rate, regular rhythm and intact distal pulses.  No murmur heard. Respiratory: Effort normal and breath sounds normal. She has no wheezes. She has no rales.  GI: Soft. She exhibits distension. There is no hepatosplenomegaly. There is tenderness (min ttp x 4Q). There is no rebound and no guarding. No hernia.  Neurological: She is alert and oriented to person, place, and time.  Normal gait and station  Skin: Skin is warm. No rash noted. No cyanosis. Nails show no  clubbing.  Normal skin turgor  Psychiatric: Judgment normal.  Appropriate affect     Assessment/Plan 57 year old female with small bowel obstruction History of gastric adenocarcinoma status post subtotal gastrectomy and B2 anastomosis Hypertension Hypothyroidism  1.  Begin the patient on small bowel traction protocol.  I discussed with the patient that she may over the resolve or the next 24 to 48 hours.  If she does not improve she may require surgery. 2.  Admit to 5 W. 3.  We will discuss with Dr. Excell Seltzer.  Ralene Ok, MD 09/14/2018, 6:32 AM

## 2018-09-15 ENCOUNTER — Inpatient Hospital Stay (HOSPITAL_COMMUNITY): Payer: Self-pay

## 2018-09-15 LAB — BASIC METABOLIC PANEL
Anion gap: 8 (ref 5–15)
BUN: 16 mg/dL (ref 6–20)
CO2: 26 mmol/L (ref 22–32)
CREATININE: 0.89 mg/dL (ref 0.44–1.00)
Calcium: 9.2 mg/dL (ref 8.9–10.3)
Chloride: 105 mmol/L (ref 98–111)
GFR calc Af Amer: >60 mL/min (ref >60)
GFR calc non Af Amer: >60 mL/min (ref >60)
Glucose, Bld: 143 mg/dL — ABNORMAL HIGH (ref 70–99)
Potassium: 4.2 mmol/L (ref 3.5–5.1)
Sodium: 139 mmol/L (ref 135–145)

## 2018-09-15 LAB — CBC
HCT: 37.7 % (ref 36.0–46.0)
Hemoglobin: 11.4 g/dL — ABNORMAL LOW (ref 12.0–15.0)
MCH: 25.5 pg — ABNORMAL LOW (ref 26.0–34.0)
MCHC: 30.2 g/dL (ref 30.0–36.0)
MCV: 84.3 fL (ref 80.0–100.0)
NRBC: 0 % (ref 0.0–0.2)
Platelets: 241 10*3/uL (ref 150–400)
RBC: 4.47 MIL/uL (ref 3.87–5.11)
RDW: 13.1 % (ref 11.5–15.5)
WBC: 5.5 10*3/uL (ref 4.0–10.5)

## 2018-09-15 MED ORDER — FLEET ENEMA 7-19 GM/118ML RE ENEM
1.0000 | ENEMA | Freq: Once | RECTAL | Status: AC
  Administered 2018-09-15: 1 via RECTAL
  Filled 2018-09-15: qty 1

## 2018-09-15 NOTE — Care Management Note (Signed)
Case Management Note  Patient Details  Name: Nancy [name] MRN: [id number] Date of Birth: [birth year]  Subjective/Objective: Spoke to patient about pcp,insurance,pharmacy. No pcp-provided with [place] pcp listing(able to make own appt)-informed able to use [facility].                    Action/Plan:d/c home.   Expected Discharge Date:  (unknown)               Expected Discharge Plan:  Home/Self Care  In-House Referral:     Discharge planning Services  CM Consult, [facility]  Post Acute Care Choice:    Choice offered to:     DME Arranged:    DME Agency:     HH Arranged:    HH Agency:     Status of Service:  In process, will continue to follow  If discussed at Long Length of Stay Meetings, dates discussed:    Additional Comments:  Dessa Phi, RN 09/15/2018, 1:51 PM

## 2018-09-15 NOTE — Progress Notes (Signed)
Patient ID: Nancy [name], female   DOB: Mar 08, 1961, 57 y.o.   MRN: [id number]       Subjective: Patient with no new complaints.  Still with abdominal tenderness.  Did pass some flatus, but not a lot and no BM.  Objective: Vital signs in last 24 hours: Temp:  [99.2 F (37.3 C)-99.6 F (37.6 C)] 99.3 F (37.4 C) (12/03 0815) Pulse Rate:  [67-73] 67 (12/03 0815) Resp:  [16-20] 20 (12/03 0815) BP: (134-152)/(68-87) 134/68 (12/03 0815) SpO2:  [91 %-100 %] 100 % (12/03 0815) Last BM Date: 09/12/18  Intake/Output from previous day: 12/02 0701 - 12/03 0700 In: 2000.1 [I.V.:2000.1] Out: 2475 [Urine:1000; Emesis/NG output:1475] Intake/Output this shift: Total I/O In: 186.3 [I.V.:186.3] Out: 575 [Emesis/NG output:575]  PE: Heart: regular Lungs: CTAB Abd: soft, obese, mild diffuse tenderness, NGT cannister just changed but with about 100cc of bilious output.  About 2L since initial placement.  Lab Results:  Recent Labs    09/14/18 0226 09/15/18 0441  WBC 10.1 5.5  HGB 13.0 11.4*  HCT 43.0 37.7  PLT 255 241   BMET Recent Labs    09/14/18 0226 09/15/18 0441  NA 139 139  K 4.0 4.2  CL 102 105  CO2 24 26  GLUCOSE 180* 143*  BUN 18 16  CREATININE 1.00 0.89  CALCIUM 10.3 9.2   PT/INR No results for input(s): LABPROT, INR in the last 72 hours. CMP     Component Value Date/Time   NA 139 09/15/2018 0441   NA 139 11/23/2012 1009   K 4.2 09/15/2018 0441   K 4.3 11/23/2012 1009   CL 105 09/15/2018 0441   CL 103 11/23/2012 1009   CO2 26 09/15/2018 0441   CO2 28 11/23/2012 1009   GLUCOSE 143 (H) 09/15/2018 0441   GLUCOSE 87 11/23/2012 1009   BUN 16 09/15/2018 0441   BUN 18.6 11/23/2012 1009   CREATININE 0.89 09/15/2018 0441   CREATININE 1.0 11/23/2012 1009   CALCIUM 9.2 09/15/2018 0441   CALCIUM 9.8 11/23/2012 1009   PROT 8.7 (H) 09/14/2018 0226   PROT 7.9 11/23/2012 1009   ALBUMIN 4.9 09/14/2018 0226   ALBUMIN 3.8 11/23/2012 1009   AST 25 09/14/2018 0226   AST 15 11/23/2012 1009   ALT 18 09/14/2018 0226   ALT 14 11/23/2012 1009   ALKPHOS 102 09/14/2018 0226   ALKPHOS 105 11/23/2012 1009   BILITOT 0.6 09/14/2018 0226   BILITOT 0.48 11/23/2012 1009   GFRNONAA >60 09/15/2018 0441   GFRAA >60 09/15/2018 0441   Lipase     Component Value Date/Time   LIPASE 23 09/14/2018 0226       Studies/Results: Dg Abd 1 View  Result Date: 09/15/2018 CLINICAL DATA:  Follow-up small-bowel obstruction. EXAM: ABDOMEN - 1 VIEW COMPARISON:  09/14/2018. FINDINGS: NG tube noted with tip over the stomach in stable position. Surgical sutures noted over the left upper quadrant. Distended loops of small bowel are again noted. Colonic gas pattern is normal. Stool noted throughout the colon. IMPRESSION: NG tube in stable position persistent. Small bowel distention without interim change. Small-bowel obstruction could again present in this fashion. Electronically Signed   By: Marcello Moores  Register   On: 09/15/2018 08:53   Ct Abdomen Pelvis W Contrast  Result Date: 09/14/2018 CLINICAL DATA:  Initial evaluation for acute abdominal pain. History of gastric cancer, SBO, pancreatitis. EXAM: CT ABDOMEN AND PELVIS WITH CONTRAST TECHNIQUE: Multidetector CT imaging of the abdomen and pelvis was performed using the standard protocol following  bolus administration of intravenous contrast. CONTRAST:  139mL ISOVUE-300 IOPAMIDOL (ISOVUE-300) INJECTION 61% COMPARISON:  Prior CT from 12/12/2017 FINDINGS: Lower chest: Scattered subsegmental atelectatic changes seen dependently within the visualized lung bases. Visualized lungs are otherwise clear. Hepatobiliary: Liver demonstrates a normal contrast enhanced appearance. Layering hyperdensity within the gallbladder likely reflects cholelithiasis. No imaging findings to suggest acute cholecystitis. No biliary dilatation. Pancreas: Pancreas somewhat atrophic without acute abnormality. Spleen: Spleen within normal limits. Adrenals/Urinary Tract:  Adrenal glands are normal. Kidneys equal in size with symmetric enhancement. No nephrolithiasis, hydronephrosis, or focal enhancing renal mass. No hydroureter. Partially distended bladder within normal limits. Stomach/Bowel: In G-tube in place with tip terminating in the distal stomach. Patient appears to be status post partial gastrectomy. Small bowel obstruction with multiple dilated loops of small bowel seen within the left and mid abdomen, measuring up to 4 cm in diameter. Fecalization of small bowel consistent with slow transit. There is a focal transition point just deep to the umbilicus in the mid abdomen (series 2, image 42). No pneumatosis or portal venous gas. This is similar to previous CT from 12/12/2017. Small bowel decompressed distally. Colon relatively of normal caliber. No other acute inflammatory changes seen about the bowels. Vascular/Lymphatic: Normal intravascular enhancement seen throughout the intra-abdominal aorta. Mild aortic atherosclerosis without aneurysm. Mesenteric vessels patent proximally. No adenopathy. Reproductive: Uterus within normal limits. Left ovary unremarkable. 6 cm right ovarian dermoid again noted, stable. Other: No free air. Small volume free perihepatic fluid, likely reactive. Small fat containing paraumbilical hernia containing a small amount of free fluid noted. Additional small ventral hernia within the upper abdomen contains fat and a short-segment portion of a loop of colon. No associated obstruction or inflammation (series 2, image 36). Musculoskeletal: No acute osseous abnormality. No discrete lytic or blastic osseous lesions. IMPRESSION: 1. Findings consistent with small bowel obstruction with focal transition point just deep to the umbilicus in the mid abdomen, similar to previous CT from 12/12/2017. 2. No other acute intra-abdominal or pelvic process identified. 3. Cholelithiasis. 4. Stable right ovarian dermoid. Electronically Signed   By: Jeannine Boga  M.D.   On: 09/14/2018 04:56   Dg Abd Acute W/chest  Result Date: 09/14/2018 CLINICAL DATA:  57 year old female with epigastric pain. EXAM: DG ABDOMEN ACUTE W/ 1V CHEST COMPARISON:  Chest radiograph dated 04/08/2018, abdominal radiograph dated 12/14/2017 and CT dated 03/01 FINDINGS: The lungs are clear. There is no pleural effusion or pneumothorax. Stable mild cardiomegaly. There is moderate stool throughout the colon. Dilated and air-filled loops of small bowel measure up to 5 cm in diameter. No free air identified. Surgical suture material noted in the upper abdomen. No acute osseous pathology. IMPRESSION: 1. No acute cardiopulmonary process. 2. Dilated small bowel loops concerning for obstruction in this patient with prior history of bowel obstruction. 3. Moderate colonic stool burden. Electronically Signed   By: Anner Crete M.D.   On: 09/14/2018 03:28   Dg Abd Portable 1v-small Bowel Obstruction Protocol-initial, 8 Hr Delay  Result Date: 09/14/2018 CLINICAL DATA:  8 hour delay.  Small bowel obstruction. EXAM: PORTABLE ABDOMEN - 1 VIEW COMPARISON:  CT abdomen pelvis 09/14/2018 FINDINGS: Multiple gaseous distended loops of small bowel within the central abdomen measuring up to 5.3 cm. Additionally small bowel feces demonstrated within dilated loops of small bowel bowel centrally. Stool throughout the colon. Enteric tube projects over the left upper quadrant. IMPRESSION: Persistent gaseous distended loops of small bowel within the central abdomen as well as small bowel loops containing small  bowel feces, compatible with small bowel obstruction. Electronically Signed   By: Lovey Newcomer M.D.   On: 09/14/2018 19:08    Anti-infectives: Anti-infectives (From admission, onward)   None       Assessment/Plan Hypertension Hypothyroid  SBO Hx adenocarcinoma of the stomach T3N2, subtotal gastrectomy/B2 anastomosis 2011 -patient with a fair amount of stool on the right side of her colon.  Will try a  fleet enema today to see if that may help get things moving at all. -film still shows some dilated small bowel.  Cont NGT for now and follow films in the am -mobilize and pulm toilet.   FEN: IV fluids/n.p.o. ID: None DVT: Lovenox Follow-up: To be determined   LOS: 1 day    Henreitta Cea , [facility] Surgery 09/15/2018, 12:46 PM Pager: [phone]

## 2018-09-16 ENCOUNTER — Inpatient Hospital Stay (HOSPITAL_COMMUNITY): Payer: Self-pay

## 2018-09-16 MED ORDER — FLEET ENEMA 7-19 GM/118ML RE ENEM
1.0000 | ENEMA | Freq: Once | RECTAL | Status: AC
  Administered 2018-09-16: 1 via RECTAL
  Filled 2018-09-16: qty 1

## 2018-09-16 NOTE — Progress Notes (Signed)
Patient ID: Nancy [name], female   DOB: [birth year], 59 y.o.   MRN: [id number]       Subjective: Patient with no nausea.  Still passing some flatus.  Had some result with enema yesterday.  Minimal NGT output since yesterday.  Only about 200cc.  Objective: Vital signs in last 24 hours: Temp:  [98.3 F (36.8 C)-99.4 F (37.4 C)] 98.3 F (36.8 C) (12/04 0605) Pulse Rate:  [64-65] 64 (12/04 0605) Resp:  [16-17] 16 (12/04 0605) BP: (130-134)/(69-79) 130/79 (12/04 0605) SpO2:  [95 %-98 %] 96 % (12/04 0605) Last BM Date: 09/12/18  Intake/Output from previous day: 12/03 0701 - 12/04 0700 In: 1282.1 [I.V.:1282.1] Out: 1075 [Urine:300; Emesis/NG output:775] Intake/Output this shift: Total I/O In: 0  Out: 25 [Emesis/NG output:25]  PE: Heart: regular Lungs: CTAB Abd: soft, NT today, ND, +BS  Lab Results:  Recent Labs    09/14/18 0226 09/15/18 0441  WBC 10.1 5.5  HGB 13.0 11.4*  HCT 43.0 37.7  PLT 255 241   BMET Recent Labs    09/14/18 0226 09/15/18 0441  NA 139 139  K 4.0 4.2  CL 102 105  CO2 24 26  GLUCOSE 180* 143*  BUN 18 16  CREATININE 1.00 0.89  CALCIUM 10.3 9.2   PT/INR No results for input(s): LABPROT, INR in the last 72 hours. CMP     Component Value Date/Time   NA 139 09/15/2018 0441   NA 139 11/23/2012 1009   K 4.2 09/15/2018 0441   K 4.3 11/23/2012 1009   CL 105 09/15/2018 0441   CL 103 11/23/2012 1009   CO2 26 09/15/2018 0441   CO2 28 11/23/2012 1009   GLUCOSE 143 (H) 09/15/2018 0441   GLUCOSE 87 11/23/2012 1009   BUN 16 09/15/2018 0441   BUN 18.6 11/23/2012 1009   CREATININE 0.89 09/15/2018 0441   CREATININE 1.0 11/23/2012 1009   CALCIUM 9.2 09/15/2018 0441   CALCIUM 9.8 11/23/2012 1009   PROT 8.7 (H) 09/14/2018 0226   PROT 7.9 11/23/2012 1009   ALBUMIN 4.9 09/14/2018 0226   ALBUMIN 3.8 11/23/2012 1009   AST 25 09/14/2018 0226   AST 15 11/23/2012 1009   ALT 18 09/14/2018 0226   ALT 14 11/23/2012 1009   ALKPHOS 102 09/14/2018 0226    ALKPHOS 105 11/23/2012 1009   BILITOT 0.6 09/14/2018 0226   BILITOT 0.48 11/23/2012 1009   GFRNONAA >60 09/15/2018 0441   GFRAA >60 09/15/2018 0441   Lipase     Component Value Date/Time   LIPASE 23 09/14/2018 0226       Studies/Results: Dg Abd 1 View  Result Date: 09/16/2018 CLINICAL DATA:  Small bowel obstruction. EXAM: ABDOMEN - 1 VIEW COMPARISON:  Radiographs of September 15, 2018. FINDINGS: The bowel gas pattern is normal. Distal tip of nasogastric tube is seen in proximal stomach. Surgical sutures are noted in the left upper quadrant. Phlebolith is noted in the pelvis. IMPRESSION: No evidence of bowel obstruction or ileus. Electronically Signed   By: Marijo Conception, M.D.   On: 09/16/2018 08:33   Dg Abd 1 View  Result Date: 09/15/2018 CLINICAL DATA:  Follow-up small-bowel obstruction. EXAM: ABDOMEN - 1 VIEW COMPARISON:  09/14/2018. FINDINGS: NG tube noted with tip over the stomach in stable position. Surgical sutures noted over the left upper quadrant. Distended loops of small bowel are again noted. Colonic gas pattern is normal. Stool noted throughout the colon. IMPRESSION: NG tube in stable position persistent. Small bowel distention without interim  change. Small-bowel obstruction could again present in this fashion. Electronically Signed   By: Marcello Moores  Register   On: 09/15/2018 08:53   Dg Abd Portable 1v-small Bowel Obstruction Protocol-initial, 8 Hr Delay  Result Date: 09/14/2018 CLINICAL DATA:  8 hour delay.  Small bowel obstruction. EXAM: PORTABLE ABDOMEN - 1 VIEW COMPARISON:  CT abdomen pelvis 09/14/2018 FINDINGS: Multiple gaseous distended loops of small bowel within the central abdomen measuring up to 5.3 cm. Additionally small bowel feces demonstrated within dilated loops of small bowel bowel centrally. Stool throughout the colon. Enteric tube projects over the left upper quadrant. IMPRESSION: Persistent gaseous distended loops of small bowel within the central abdomen as  well as small bowel loops containing small bowel feces, compatible with small bowel obstruction. Electronically Signed   By: Lovey Newcomer M.D.   On: 09/14/2018 19:08    Anti-infectives: Anti-infectives (From admission, onward)   None       Assessment/Plan Hypertension Hypothyroid  SBO Hx adenocarcinoma of the stomach T3N2, subtotal gastrectomy/B2 anastomosis 2011 -patient passing some flatus.  Pain resolved.  Will DC NGT and start on clear liquids as film shows resolution of SBO as well. -stool content has moved to left side with enema yesterday.  Will give one more today to help clear her colon.  FEN: IV fluids/CLD ID: None DVT: Lovenox Follow-up: PCP   LOS: 2 days    Henreitta Cea , [facility] Surgery 09/16/2018, 10:52 AM Pager: [phone]

## 2018-09-17 NOTE — Discharge Instructions (Signed)
Small Bowel Obstruction °A small bowel obstruction means that something is blocking the small bowel. The small bowel is also called the small intestine. It is the long tube that connects the stomach to the colon. An obstruction will stop food and fluids from passing through the small bowel. Treatment depends on what is causing the problem and how bad the problem is. °Follow these instructions at home: °· Get a lot of rest. °· Follow your diet as told by your doctor. You may need to: °? Only drink clear liquids until you start to get better. °? Avoid solid foods as told by your doctor. °· Take over-the-counter and prescription medicines only as told by your doctor. °· Keep all follow-up visits as told by your doctor. This is important. °Contact a doctor if: °· You have a fever. °· You have chills. °Get help right away if: °· You have pain or cramps that get worse. °· You throw up (vomit) blood. °· You have a feeling of being sick to your stomach (nausea) that does not go away. °· You cannot stop throwing up. °· You cannot drink fluids. °· You feel confused. °· You feel dry or thirsty (dehydrated). °· Your belly gets more bloated. °· You feel weak or you pass out (faint). °This information is not intended to replace advice given to you by your health care provider. Make sure you discuss any questions you have with your health care provider. °Document Released: 11/07/2004 Document Revised: 05/27/2016 Document Reviewed: 11/24/2014 °Elsevier Interactive Patient Education © 2018 [employer] ° °

## 2018-09-17 NOTE — Progress Notes (Signed)
Discharge and medication instructions reviewed with patient. Questions answered and patient denies further questions. No prescriptions given. Spouse is coming to drive patient home between 2000 and 2100 tonight. Donne Hazel, RN

## 2018-09-17 NOTE — Discharge Summary (Signed)
     Patient ID: Nancy [name] [id number] [id number] 57 y.o.  Admit date: 09/14/2018 Discharge date: 09/17/2018  Admitting Diagnosis: SBO  Discharge Diagnosis Patient Active Problem List   Diagnosis Date Noted  . SBO (small bowel obstruction) ([place]) 12/12/2017  . Malignant neoplasm of stomach, unspecified site 11/11/2011  . Painful Port-a-cath 10/22/2011  . Anemia 05/20/2011  . Dermoid cyst of ovary 04/13/2011  . Fibroids 04/13/2011  . 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  Patient also has history of hypertension, hypothyroidism.  Patient states that she began with abdominal pain yesterday afternoon approximately 4 PM.  She states that thereafter she had some nausea and vomiting.  She states that the pain continued and presented to the ER for further evaluation and management.  Upon evaluation the ER she underwent KUB as well as CT scan which was consistent with small bowel obstruction as well as several small incisional hernia/ventral hernias.  The hernias do not appear to be the point of transition.  I did review the CT scans personally.  Patient had NG tube placed within the ER.  Procedures none  Hospital Course:  The patient was admitted and had an NGT placed.  She underwent the SBO protocol.  Her obstruction ultimately resolved on HD 2.  She had her diet advanced as tolerated.  On HD she was tolerating a soft diet, no abdominal pain, and otherwise medically stable.  She was stable for DC home.    Physical Exam: Gen: NAD Heart: regular Lungs: CTAB Abd: soft, NT, ND, +BS  Allergies as of 09/17/2018      Reactions   Niacin And Related Other (See Comments)   flushing      Medication List    STOP taking these medications   predniSONE 20 MG  tablet Commonly known as:  DELTASONE     TAKE these medications   ferrous sulfate 325 (65 FE) MG EC tablet Take 325 mg by mouth 2 (two) times daily.        Follow-up Information    [facility]. Schedule an appointment as soon as possible for a visit.   Contact information: [place] [address] [place]. Schedule an appointment as soon as possible for a visit.   Contact information: [place] [address] [phone]       [facility]. Schedule an appointment as soon as possible for a visit.   Contact information: [address] [place] [address]        [facility]. Schedule an appointment as soon as possible for a visit.   Specialty:  Internal Medicine Contact information: [place] [id number] [place] [phone]          Signed: Saverio Danker, [facility] 09/17/2018, 11:31 AM Pager: [phone]
